# Patient Record
Sex: Female | Born: 1995 | Hispanic: Yes | Marital: Single | Smoking: Former smoker
Health system: Southern US, Community
[De-identification: ages and names within clinical notes are randomized; demographics above are authoritative.]

## PROBLEM LIST (undated history)

## (undated) ENCOUNTER — Inpatient Hospital Stay (HOSPITAL_COMMUNITY): Payer: Self-pay

## (undated) DIAGNOSIS — Z789 Other specified health status: Secondary | ICD-10-CM

## (undated) HISTORY — PX: WISDOM TOOTH EXTRACTION: SHX21

---

## 2018-01-07 ENCOUNTER — Encounter: Payer: Self-pay | Admitting: *Deleted

## 2018-01-08 ENCOUNTER — Other Ambulatory Visit (HOSPITAL_COMMUNITY)
Admission: RE | Admit: 2018-01-08 | Discharge: 2018-01-08 | Disposition: A | Discharge status: Home / Self Care | Payer: Medicaid Other | Source: Ambulatory Visit | Attending: Advanced Practice Midwife | Admitting: Advanced Practice Midwife

## 2018-01-08 ENCOUNTER — Ambulatory Visit (INDEPENDENT_AMBULATORY_CARE_PROVIDER_SITE_OTHER): Payer: Medicaid Other | Admitting: Advanced Practice Midwife

## 2018-01-08 ENCOUNTER — Encounter: Payer: Self-pay | Admitting: Advanced Practice Midwife

## 2018-01-08 VITALS — BP 120/71 | HR 88 | Ht 61.0 in | Wt 183.0 lb

## 2018-01-08 DIAGNOSIS — O219 Vomiting of pregnancy, unspecified: Secondary | ICD-10-CM

## 2018-01-08 DIAGNOSIS — Z3481 Encounter for supervision of other normal pregnancy, first trimester: Secondary | ICD-10-CM | POA: Insufficient documentation

## 2018-01-08 DIAGNOSIS — Z3480 Encounter for supervision of other normal pregnancy, unspecified trimester: Secondary | ICD-10-CM | POA: Insufficient documentation

## 2018-01-08 DIAGNOSIS — Z3687 Encounter for antenatal screening for uncertain dates: Secondary | ICD-10-CM

## 2018-01-08 MED ORDER — CONCEPT DHA 53.5-38-1 MG PO CAPS
1.0000 | ORAL_CAPSULE | Freq: Every day | ORAL | 12 refills | Status: DC
Start: 2018-01-08 — End: 2018-08-02

## 2018-01-08 MED ORDER — PROMETHAZINE HCL 25 MG PO TABS: 25.0000 mg | ORAL_TABLET | Freq: Four times a day (QID) | ORAL | 2 refills | Status: DC | PRN

## 2018-01-08 NOTE — Progress Notes (Signed)
  Subjective:    Katrina Foster is being seen today for her first obstetrical visit.  This is a planned pregnancy. She is at 6462w0d gestation by LMP and US today. Her obstetrical history is significant for nothing. Relationship with FOB: significant other, living together. Patient does intend to breast feed. Pregnancy history fully reviewed.  Patient reports headache, nausea and vomiting. Vomiting ~2 x per day after getting Rx Zofran at ED. Still having a lot of nausea and can't eat well. Hasn't lost weight. Stopped drinking coffee when she found out she was pregnant  Review of Systems:   Review of Systems  Constitutional: Negative for appetite change, chills and fever.  Eyes: Negative for visual disturbance.  Gastrointestinal: Positive for nausea and vomiting. Negative for abdominal pain and diarrhea.  Genitourinary: Negative for dysuria, pelvic pain, vaginal bleeding and vaginal discharge.  Neurological: Positive for headaches. Negative for dizziness.    Objective:     LMP 10/30/2017  Physical Exam  Nursing note and vitals reviewed. Constitutional: She is oriented to person, place, and time. She appears well-developed and well-nourished. No distress.  Eyes: No scleral icterus.  Cardiovascular: Normal rate, regular rhythm and normal heart sounds.  Respiratory: Effort normal and breath sounds normal. No respiratory distress.  GI: Soft. She exhibits no distension. There is no tenderness.  Genitourinary: Vagina normal. No vaginal discharge found.  Genitourinary Comments: Uterus 10 week size  Musculoskeletal: She exhibits no edema.  Neurological: She is alert and oriented to person, place, and time. She has normal reflexes.  Skin: Skin is warm and dry.  Psychiatric: She has a normal mood and affect.    Maternal Exam:  Introitus: Vagina is negative for discharge.  Pelvis: adequate for delivery.   Cervix: Cervix evaluated by sterile speculum exam and digital exam.     Fetal  Exam Fetal Monitor Review: Mode: ultrasound.   Baseline rate: Pos cardiac activity per US, 10.3 week CRL.         Assessment:    Pregnancy: G1P0 Patient Active Problem List   Diagnosis Date Noted  . Encounter for supervision of normal first pregnancy in first trimester 01/08/2018     1. Encounter for supervision of normal pregnancy in first trimester  - Culture, OB Urine - Urine cytology ancillary only - Sickle Cell Scr - Cystic fibrosis diagnostic study - Obstetric panel - HIV antibody (with reflex) - Cytology - PAP - Babyscripts Schedule Optimization  2. N/V pregnancy  - Phenergan     Plan:     Initial labs drawn. Prenatal vitamins. Problem list reviewed and updated. NIPS discussed: requested. Will do at [facility] since just 10 weeks today Role of ultrasound in pregnancy discussed; fetal survey: requested. Amniocentesis discussed: not indicated. Follow up in 4 weeks. Discussed clinic routines, schedule of care and testing, genetic screening options, involvement of students and residents under the direct supervision of APPs and doctors and presence of female providers. Pt verbalized understanding.   Dorathy KinsmanVirginia Chavela Justiniano 01/08/2018

## 2018-01-08 NOTE — Patient Instructions (Signed)

## 2018-01-11 LAB — CULTURE, OB URINE

## 2018-01-11 LAB — URINE CYTOLOGY ANCILLARY ONLY
CHLAMYDIA, DNA PROBE: NEGATIVE
NEISSERIA GONORRHEA: NEGATIVE

## 2018-01-11 LAB — URINE CULTURE, OB REFLEX

## 2018-01-12 LAB — CYTOLOGY - PAP: DIAGNOSIS: NEGATIVE

## 2018-01-16 LAB — CYSTIC FIBROSIS DIAGNOSTIC STUDY

## 2018-01-16 LAB — OBSTETRIC PANEL
Antibody Screen: NOT DETECTED
BASOS PCT: 0.6 %
Basophils Absolute: 65 cells/uL (ref 0–200)
EOS ABS: 294 {cells}/uL (ref 15–500)
Eosinophils Relative: 2.7 %
HEMATOCRIT: 38.2 % (ref 35.0–45.0)
Hemoglobin: 12.9 g/dL (ref 11.7–15.5)
Hepatitis B Surface Ag: NONREACTIVE
LYMPHS ABS: 1995 {cells}/uL (ref 850–3900)
MCH: 27.3 pg (ref 27.0–33.0)
MCHC: 33.8 g/dL (ref 32.0–36.0)
MCV: 80.8 fL (ref 80.0–100.0)
MONOS PCT: 5.2 %
MPV: 10 fL (ref 7.5–12.5)
Neutro Abs: 7979 cells/uL — ABNORMAL HIGH (ref 1500–7800)
Neutrophils Relative %: 73.2 %
Platelets: 426 10*3/uL — ABNORMAL HIGH (ref 140–400)
RBC: 4.73 10*6/uL (ref 3.80–5.10)
RDW: 14.5 % (ref 11.0–15.0)
RPR: NONREACTIVE
Rubella: 2.17 index
Total Lymphocyte: 18.3 %
WBC mixed population: 567 cells/uL (ref 200–950)
WBC: 10.9 10*3/uL — ABNORMAL HIGH (ref 3.8–10.8)

## 2018-01-16 LAB — SICKLE CELL SCREEN: Sickle Solubility Test - HGBRFX: NEGATIVE

## 2018-01-16 LAB — HIV ANTIBODY (ROUTINE TESTING W REFLEX): HIV: NONREACTIVE

## 2018-01-22 ENCOUNTER — Ambulatory Visit (INDEPENDENT_AMBULATORY_CARE_PROVIDER_SITE_OTHER): Payer: Medicaid Other | Admitting: Certified Nurse Midwife

## 2018-01-22 VITALS — BP 113/75 | HR 80 | Wt 178.0 lb

## 2018-01-22 DIAGNOSIS — O219 Vomiting of pregnancy, unspecified: Secondary | ICD-10-CM

## 2018-01-22 DIAGNOSIS — Z3402 Encounter for supervision of normal first pregnancy, second trimester: Secondary | ICD-10-CM

## 2018-01-22 DIAGNOSIS — Z3481 Encounter for supervision of other normal pregnancy, first trimester: Secondary | ICD-10-CM

## 2018-01-22 MED ORDER — SCOPOLAMINE 1 MG/3DAYS TD PT72: 1.0000 | MEDICATED_PATCH | TRANSDERMAL | 1 refills | Status: DC

## 2018-01-22 MED ORDER — RANITIDINE HCL 150 MG PO TABS: 150.0000 mg | ORAL_TABLET | Freq: Every day | ORAL | 1 refills | Status: DC

## 2018-01-22 MED ORDER — DOXYLAMINE-PYRIDOXINE 10-10 MG PO TBEC: 2.0000 | DELAYED_RELEASE_TABLET | Freq: Every day | ORAL | 1 refills | Status: DC

## 2018-01-22 NOTE — Patient Instructions (Signed)
Morning Sickness °Morning sickness is when you feel sick to your stomach (nauseous) during pregnancy. You may feel sick to your stomach and throw up (vomit). You may feel sick in the morning, but you can feel this way any time of day. Some women feel very sick to their stomach and cannot stop throwing up (hyperemesis gravidarum). °Follow these instructions at home: °· Only take medicines as told by your doctor. °· Take multivitamins as told by your doctor. Taking multivitamins before getting pregnant can stop or lessen the harshness of morning sickness. °· Eat dry toast or unsalted crackers before getting out of bed. °· Eat 5 to 6 small meals a day. °· Eat dry and bland foods like rice and baked potatoes. °· Do not drink liquids with meals. Drink between meals. °· Do not eat greasy, fatty, or spicy foods. °· Have someone cook for you if the smell of food causes you to feel sick or throw up. °· If you feel sick to your stomach after taking prenatal vitamins, take them at night or with a snack. °· Eat protein when you need a snack (nuts, yogurt, cheese). °· Eat unsweetened gelatins for dessert. °· Wear a bracelet used for sea sickness (acupressure wristband). °· Go to a doctor that puts thin needles into certain body points (acupuncture) to improve how you feel. °· Do not smoke. °· Use a humidifier to keep the air in your house free of odors. °· Get lots of fresh air. °Contact a doctor if: °· You need medicine to feel better. °· You feel dizzy or lightheaded. °· You are losing weight. °Get help right away if: °· You feel very sick to your stomach and cannot stop throwing up. °· You pass out (faint). °This information is not intended to replace advice given to you by your health care provider. Make sure you discuss any questions you have with your health care provider. °Document Released: 08/07/2004 Document Revised: 12/06/2015 Document Reviewed: 12/15/2012 °Elsevier Interactive Patient Education © 2017 Elsevier  Inc. °Eating Plan for Hyperemesis Gravidarum °Hyperemesis gravidarum is a severe form of morning sickness. Because this condition causes severe nausea and vomiting, it can lead to dehydration, malnutrition, and weight loss. One way to lessen the symptoms of nausea and vomiting is to follow the eating plan for hyperemesis gravidarum. It is often used along with prescribed medicines to control your symptoms. °What can I do to relieve my symptoms? °Listen to your body. Everyone is different and has different preferences. Find what works best for you. Take any of the following actions that are helpful to you: °· Eat and drink slowly. °· Eat 5-6 small meals daily instead of 3 large meals. °· Eat crackers before you get out of bed in the morning. °· Try having a snack in the middle of the night. °· Starchy foods are usually tolerated well. Examples include cereal, toast, bread, potatoes, pasta, rice, and pretzels. °· Ginger may help with nausea. Add ¼ tsp ground ginger to hot tea or choose ginger tea. °· Try drinking 100% fruit juice or an electrolyte drink. An electrolyte drink contains sodium, potassium, and chloride. °· Continue to take your prenatal vitamins as told by your health care provider. If you are having trouble taking your prenatal vitamins, talk with your health care provider about different options. °· Include at least 1 serving of protein with your meals and snacks. Protein options include meats or poultry, beans, nuts, eggs, and yogurt. Try eating a protein-rich snack before bed. Examples   of these snacks include cheese and crackers or half of a peanut butter or turkey sandwich. °· Consider eliminating foods that trigger your symptoms. These may include spicy foods, coffee, high-fat foods, very sweet foods, and acidic foods. °· Try meals that have more protein combined with bland, salty, lower-fat, and dry foods, such as nuts, seeds, pretzels, crackers, and cereal. °· Talk with your healthcare provider  about starting a supplement of vitamin B6. °· Have fluids that are cold, clear, and carbonated or sour. Examples include lemonade, ginger ale, lemon-lime soda, ice water, and sparkling water. °· Try lemon or mint tea. °· Try brushing your teeth or using a mouth rinse after meals. ° °What should I avoid to reduce my symptoms? °Avoiding some of the following things may help reduce your symptoms. °· Foods with strong smells. Try eating meals in well-ventilated areas that are free of odors. °· Drinking water or other beverages with meals. Try not to drink anything during the 30 minutes before and after your meals. °· Drinking more than 1 cup of fluid at a time. Sometimes using a straw helps. °· Fried or high-fat foods, such as butter and cream sauces. °· Spicy foods. °· Skipping meals as best as you can. Nausea can be more intense on an empty stomach. If you cannot tolerate food at that time, do not force it. Try sucking on ice chips or other frozen items, and make up for missed calories later. °· Lying down within 2 hours after eating. °· Environmental triggers. These may include smoky rooms, closed spaces, rooms with strong smells, warm or humid places, overly loud and noisy rooms, and rooms with motion or flickering lights. °· Quick and sudden changes in your movement. ° °This information is not intended to replace advice given to you by your health care provider. Make sure you discuss any questions you have with your health care provider. °Document Released: 04/27/2007 Document Revised: 02/27/2016 Document Reviewed: 01/29/2016 °Elsevier Interactive Patient Education © 2018 Elsevier Inc. ° °

## 2018-01-22 NOTE — Progress Notes (Signed)
Subjective:  Katrina Foster is a 22 y.o. G2P1001 at 3073w0d being seen today for ongoing prenatal care.  She is currently monitored for the following issues for this low-risk pregnancy and has Supervision of other normal pregnancy, antepartum and Nausea and vomiting of pregnancy, antepartum on their problem list.  Patient reports nausea and vomiting.   . Vag. Bleeding: None.  Movement: Absent. Denies leaking of fluid.   The following portions of the patient's history were reviewed and updated as appropriate: allergies, current medications, past family history, past medical history, past social history, past surgical history and problem list. Problem list updated.  Objective:   Vitals:   01/22/18 0924  BP: 113/75  Pulse: 80  Weight: 178 lb (80.7 kg)    Fetal Status: Fetal Heart Rate (bpm): 163   Movement: Absent     General:  Alert, oriented and cooperative. Patient is in no acute distress.  Skin: Skin is warm and dry. No rash noted.   Cardiovascular: Normal heart rate noted  Respiratory: Normal respiratory effort, no problems with respiration noted  Abdomen: Soft, gravid, appropriate for gestational age. Pain/Pressure: Absent     Pelvic: Vag. Bleeding: None Vag D/C Character: Thin   Cervical exam deferred        Extremities: Normal range of motion.  Edema: None  Mental Status: Normal mood and affect. Normal behavior. Normal judgment and thought content.   Urinalysis:      Assessment and Plan:  Pregnancy: G2P1001 at 2373w0d  1. Encounter for supervision of normal first pregnancy in second trimester - Genetic Screening - US MFM OB COMP + 14 WK; Future  2. Nausea/vomiting in pregnancy - vomiting daily - 5 lb weight loss - Phenergan and Zofran not helping - scopolamine (TRANSDERM-SCOP) 1 MG/3DAYS; Place 1 patch (1.5 mg total) onto the skin every 3 (three) days.  Dispense: 10 patch; Refill: 1 - Doxylamine-Pyridoxine 10-10 MG TBEC; Take 2 tablets by mouth at bedtime. May also  take 1 tab in morning and 1 tab in afternoon  Dispense: 100 tablet; Refill: 1 - ranitidine (ZANTAC) 150 MG tablet; Take 1 tablet (150 mg total) by mouth at bedtime.  Dispense: 60 tablet; Refill: 1   Preterm labor symptoms and general obstetric precautions including but not limited to vaginal bleeding, contractions, leaking of fluid and fetal movement were reviewed in detail with the patient. Please refer to After Visit Summary for other counseling recommendations.  Return in about 8 weeks (around 03/19/2018).   Donette LarryBhambri, Burke Terry, CNM

## 2018-01-22 NOTE — Progress Notes (Signed)
Error

## 2018-01-29 DIAGNOSIS — Z3480 Encounter for supervision of other normal pregnancy, unspecified trimester: Secondary | ICD-10-CM

## 2018-02-01 ENCOUNTER — Telehealth: Payer: Self-pay | Admitting: *Deleted

## 2018-02-01 NOTE — Telephone Encounter (Signed)
Pt called requesting her Nipt results which are still pending.  Her Scopolamine patches were not approved by her insurance but the Diglegis was.

## 2018-02-16 ENCOUNTER — Other Ambulatory Visit: Payer: Medicaid Other

## 2018-02-25 ENCOUNTER — Telehealth: Payer: Self-pay

## 2018-02-25 DIAGNOSIS — Z3480 Encounter for supervision of other normal pregnancy, unspecified trimester: Secondary | ICD-10-CM

## 2018-02-25 NOTE — Telephone Encounter (Signed)
Left message on pt's phone letting her know that Nips result was back. Asked pt to call the office to let us know if she wants to know over the phone or if she wants to pick it up in an envelope. I will also send MyChart message.

## 2018-03-03 ENCOUNTER — Encounter (HOSPITAL_COMMUNITY): Payer: Self-pay

## 2018-03-10 ENCOUNTER — Ambulatory Visit (HOSPITAL_COMMUNITY)
Admission: RE | Admit: 2018-03-10 | Discharge: 2018-03-10 | Disposition: A | Discharge status: Home / Self Care | Payer: Medicaid Other | Source: Ambulatory Visit | Attending: Certified Nurse Midwife | Admitting: Certified Nurse Midwife

## 2018-03-10 ENCOUNTER — Other Ambulatory Visit: Payer: Self-pay | Admitting: Certified Nurse Midwife

## 2018-03-10 DIAGNOSIS — O99212 Obesity complicating pregnancy, second trimester: Secondary | ICD-10-CM

## 2018-03-10 DIAGNOSIS — Z3480 Encounter for supervision of other normal pregnancy, unspecified trimester: Secondary | ICD-10-CM

## 2018-03-10 DIAGNOSIS — Z3402 Encounter for supervision of normal first pregnancy, second trimester: Secondary | ICD-10-CM

## 2018-03-10 DIAGNOSIS — Z363 Encounter for antenatal screening for malformations: Secondary | ICD-10-CM

## 2018-03-10 DIAGNOSIS — Z3A18 18 weeks gestation of pregnancy: Secondary | ICD-10-CM | POA: Diagnosis not present

## 2018-03-11 ENCOUNTER — Other Ambulatory Visit (HOSPITAL_COMMUNITY): Payer: Self-pay | Admitting: *Deleted

## 2018-03-11 DIAGNOSIS — O3503X0 Maternal care for (suspected) central nervous system malformation or damage in fetus, choroid plexus cysts, not applicable or unspecified: Secondary | ICD-10-CM

## 2018-03-11 DIAGNOSIS — O350XX0 Maternal care for (suspected) central nervous system malformation in fetus, not applicable or unspecified: Secondary | ICD-10-CM

## 2018-03-19 ENCOUNTER — Encounter: Payer: Self-pay | Admitting: Advanced Practice Midwife

## 2018-03-19 ENCOUNTER — Ambulatory Visit (INDEPENDENT_AMBULATORY_CARE_PROVIDER_SITE_OTHER): Payer: Medicaid Other | Admitting: Advanced Practice Midwife

## 2018-03-19 VITALS — BP 112/67 | HR 72 | Wt 174.0 lb

## 2018-03-19 DIAGNOSIS — O2612 Low weight gain in pregnancy, second trimester: Secondary | ICD-10-CM

## 2018-03-19 DIAGNOSIS — O2610 Low weight gain in pregnancy, unspecified trimester: Secondary | ICD-10-CM | POA: Insufficient documentation

## 2018-03-19 DIAGNOSIS — Z3482 Encounter for supervision of other normal pregnancy, second trimester: Secondary | ICD-10-CM

## 2018-03-19 DIAGNOSIS — Z3480 Encounter for supervision of other normal pregnancy, unspecified trimester: Secondary | ICD-10-CM

## 2018-03-19 NOTE — Patient Instructions (Signed)
[email or web address]   Pregnancy and Influenza Influenza, also called the flu, is an infection of the respiratory tract. If you are pregnant, you are more likely to catch the flu. You are also more likely to have a more serious case of the flu. This is because pregnancy lowers your body's ability to fight off infections (it weakens your immune system). It also puts additional stress on your heart and lungs, which makes you more likely to have complications. Having a bad case of the flu, especially with a high fever, can be dangerous for your developing baby. It can cause you to go into early labor. How do people get the flu? The flu is caused by the influenza virus. This virus is common every year in the fall and winter. It spreads when virus particles get passed from person to person. You can get the virus if you are near a sick person who is coughing or sneezing. You can also get the virus if you touch something that has the virus on it and then touch your face. How can I protect myself against the flu?  Get a flu shot. The best way to prevent the flu is to get a flu shot before flu season starts. The flu shot is not dangerous for your developing baby. It may even help protect your baby from the flu for up to 6 months after birth. The flu shot is one type of flu vaccine. Another type is a nasal spray vaccine. Do not get the nasal spray vaccine. It is not approved for pregnancy.  Do not come in close contact with sick people.  Do not share food, drinks, or utensils with other people.  Wash your hands often. Use hand sanitizer when soap and water are not available. What should I do if I have flu symptoms? If you have any flu symptoms, call your health care provider right away. Flu symptoms include:  Fever or chills.  Muscle aches.  Headache.  Sore throat.  Nasal congestion.  Cough.  Feeling tired.  Loss of appetite.  Vomiting.  Diarrhea.  You may be able to take an antiviral  medicine to keep the flu from becoming severe and to shorten how long it lasts. What should I do at home if I am diagnosed with the flu?  Do not take any medicine, including cold or flu medicine, unless directed by your health care provider.  If you take antiviral medicine, make sure you finish it even if you start to feel better.  Drink enough fluid to keep your urine clear or pale yellow.  Get plenty of rest. When would I seek immediate medical care if I have the flu?  You have trouble breathing.  You have chest pain.  You begin to have labor pains.  You have a high fever that does not go down after you take medicine.  You do not feel your baby move.  You have diarrhea or vomiting that will not go away. This information is not intended to replace advice given to you by your health care provider. Make sure you discuss any questions you have with your health care provider. Document Released: 05/02/2008 Document Revised: 12/06/2015 Document Reviewed: 05/27/2013 Elsevier Interactive Patient Education  2017 Elsevier Inc.   TDaP Vaccine Pregnancy Get the Whooping Cough Vaccine While You Are Pregnant (CDC)  It is important for women to get the whooping cough vaccine in the third trimester of each pregnancy. Vaccines are the best way to prevent this disease. There  are 2 different whooping cough vaccines. Both vaccines combine protection against whooping cough, tetanus and diphtheria, but they are for different age groups: Tdap: for everyone 11 years or older, including pregnant women  DTaP: for children 2 months through 51 years of age  You need the whooping cough vaccine during each of your pregnancies The recommended time to get the shot is during your 27th through 36th week of pregnancy, preferably during the earlier part of this time period. The Centers for Disease Control and Prevention (CDC) recommends that pregnant women receive the whooping cough vaccine for adolescents and  adults (called Tdap vaccine) during the third trimester of each pregnancy. The recommended time to get the shot is during your 27th through 36th week of pregnancy, preferably during the earlier part of this time period. This replaces the original recommendation that pregnant women get the vaccine only if they had not previously received it. The [employer] of [employer]stetricians and Gynecologists and the [employer] support this recommendation.  You should get the whooping cough vaccine while pregnant to pass protection to your baby frame support disabled and/or not supported in this browser  Learn why Katrina Foster decided to get the whooping cough vaccine in her 3rd trimester of pregnancy and how her baby girl was born with some protection against the disease. Also available on YouTube. After receiving the whooping cough vaccine, your body will create protective antibodies (proteins produced by the body to fight off diseases) and pass some of them to your baby before birth. These antibodies provide your baby some short-term protection against whooping cough in early life. These antibodies can also protect your baby from some of the more serious complications that come along with whooping cough. Your protective antibodies are at their highest about 2 weeks after getting the vaccine, but it takes time to pass them to your baby. So the preferred time to get the whooping cough vaccine is early in your third trimester. The amount of whooping cough antibodies in your body decreases over time. That is why CDC recommends you get a whooping cough vaccine during each pregnancy. Doing so allows each of your babies to get the greatest number of protective antibodies from you. This means each of your babies will get the best protection possible against this disease.  Getting the whooping cough vaccine while pregnant is better than getting the vaccine after you give birth Whooping cough vaccination  during pregnancy is ideal so your baby will have short-term protection as soon as he is born. This early protection is important because your baby will not start getting his whooping cough vaccines until he is 2 months old. These first few months of life are when your baby is at greatest risk for catching whooping cough. This is also when he's at greatest risk for having severe, potentially life-threating complications from the infection. To avoid that gap in protection, it is best to get a whooping cough vaccine during pregnancy. You will then pass protection to your baby before he is born. To continue protecting your baby, he should get whooping cough vaccines starting at 2 months old. You may never have gotten the Tdap vaccine before and did not get it during this pregnancy. If so, you should make sure to get the vaccine immediately after you give birth, before leaving the hospital or birthing center. It will take about 2 weeks before your body develops protection (antibodies) in response to the vaccine. Once you have protection from the vaccine, you  are less likely to give whooping cough to your newborn while caring for him. But remember, your baby will still be at risk for catching whooping cough from others. A recent study looked to see how effective Tdap was at preventing whooping cough in babies whose mothers got the vaccine while pregnant or in the hospital after giving birth. The study found that getting Tdap between 27 through 36 weeks of pregnancy is 85% more effective at preventing whooping cough in babies younger than 2 months old. Blood tests cannot tell if you need a whooping cough vaccine There are no blood tests that can tell you if you have enough antibodies in your body to protect yourself or your baby against whooping cough. Even if you have been sick with whooping cough in the past or previously received the vaccine, you still should get the vaccine during each pregnancy. Breastfeeding may  pass some protective antibodies onto your baby By breastfeeding, you may pass some antibodies you have made in response to the vaccine to your baby. When you get a whooping cough vaccine during your pregnancy, you will have antibodies in your breast milk that you can share with your baby as soon as your milk comes in. However, your baby will not get protective antibodies immediately if you wait to get the whooping cough vaccine until after delivering your baby. This is because it takes about 2 weeks for your body to create antibodies. Learn more about the health benefits of breastfeeding.

## 2018-03-19 NOTE — Progress Notes (Signed)
   PRENATAL VISIT NOTE  Subjective:  Katrina Foster is a 22 y.o. G2P1001 at [redacted]w[redacted]d being seen today for ongoing prenatal care.  She is currently monitored for the following issues for this low-risk pregnancy and has Supervision of other normal pregnancy, antepartum; Nausea and vomiting of pregnancy, antepartum; and Poor weight gain of pregnancy on their problem list.  Patient reports no complaints.   . Vag. Bleeding: None.  Movement: Present. Denies leaking of fluid.   Down 6 lb at 20 weeks. N/V have resolved, but pt can't eat full meals. Reports healthy diet. Encouraged small frequent, nutritionally-rich meals and snacks. Growth [place] scheduled.   Discussed difficulties w/ latch w/ first baby. Stopped BF after 1 month. Worried that it will happen again.   The following portions of the patient's history were reviewed and updated as appropriate: allergies, current medications, past family history, past medical history, past social history, past surgical history and problem list. Problem list updated.  Objective:   Vitals:   03/19/18 1017  BP: 112/67  Pulse: 72  Weight: 174 lb (78.9 kg)    Fetal Status: Fetal Heart Rate (bpm): 143   Movement: Present     General:  Alert, oriented and cooperative. Patient is in no acute distress.  Skin: Skin is warm and dry. No rash noted.   Cardiovascular: Normal heart rate noted  Respiratory: Normal respiratory effort, no problems with respiration noted  Abdomen: Soft, gravid, appropriate for gestational age.  Pain/Pressure: Absent     Pelvic: Cervical exam deferred        Extremities: Normal range of motion.  Edema: None  Mental Status: Normal mood and affect. Normal behavior. Normal judgment and thought content.   Assessment and Plan:  Pregnancy: G2P1001 at [redacted]w[redacted]d  1. Supervision of other normal pregnancy, antepartum  - F/U US 10/23 for CPC per MFM  - Alpha fetoprotein, maternal - Encouraged Flu shot, Lactation visit before  delivery  Preterm labor symptoms and general obstetric precautions including but not limited to vaginal bleeding, contractions, leaking of fluid and fetal movement were reviewed in detail with the patient. Please refer to After Visit Summary for other counseling recommendations.  Return in about 4 weeks (around 04/16/2018) for ROB.  Future Appointments  Date Time Provider Department Center  04/16/2018 10:15 AM Rolm Bookbinder, CNM [facility]  05/05/2018  3:00 PM [facility] [place] 3 [facility]    Dorathy Kinsman, [place]

## 2018-03-22 LAB — ALPHA FETOPROTEIN, MATERNAL
AFP MoM: 1.06
AFP, Serum: 53 ng/mL
Calc'd Gestational Age: 20 weeks
Maternal Wt: 180 [lb_av]
Twins-AFP: 1

## 2018-04-15 ENCOUNTER — Telehealth: Payer: Self-pay | Admitting: *Deleted

## 2018-04-15 NOTE — Telephone Encounter (Signed)
Left patient an appointment reminder message because she has not contacted me to acknowledge or change appointment. MyChart message has not been read either.

## 2018-04-16 ENCOUNTER — Ambulatory Visit: Payer: Medicaid Other | Admitting: Advanced Practice Midwife

## 2018-04-17 NOTE — Progress Notes (Signed)
Pt unable to wait to be seen today. She rescheduled appointment and was not seen by provider.

## 2018-04-23 ENCOUNTER — Ambulatory Visit (INDEPENDENT_AMBULATORY_CARE_PROVIDER_SITE_OTHER): Payer: Medicaid Other | Admitting: Certified Nurse Midwife

## 2018-04-23 VITALS — BP 112/64 | HR 69 | Wt 179.0 lb

## 2018-04-23 DIAGNOSIS — Z3480 Encounter for supervision of other normal pregnancy, unspecified trimester: Secondary | ICD-10-CM

## 2018-04-23 DIAGNOSIS — Z23 Encounter for immunization: Secondary | ICD-10-CM

## 2018-04-23 DIAGNOSIS — O219 Vomiting of pregnancy, unspecified: Secondary | ICD-10-CM

## 2018-04-23 NOTE — Progress Notes (Signed)
Subjective:  Katrina Foster is a 22 y.o. G2P1001 at [redacted]w[redacted]d being seen today for ongoing prenatal care.  She is currently monitored for the following issues for this low-risk pregnancy and has Supervision of other normal pregnancy, antepartum; Nausea and vomiting of pregnancy, antepartum; and Poor weight gain of pregnancy on their problem list.  Patient reports no complaints.  Contractions: Not present. Vag. Bleeding: None.  Movement: Present. Denies leaking of fluid.   The following portions of the patient's history were reviewed and updated as appropriate: allergies, current medications, past family history, past medical history, past social history, past surgical history and problem list. Problem list updated.  Objective:   Vitals:   04/23/18 1028  BP: 112/64  Pulse: 69  Weight: 81.2 kg    Fetal Status: Fetal Heart Rate (bpm): 136   Movement: Present     General:  Alert, oriented and cooperative. Patient is in no acute distress.  Skin: Skin is warm and dry. No rash noted.   Cardiovascular: Normal heart rate noted  Respiratory: Normal respiratory effort, no problems with respiration noted  Abdomen: Soft, gravid, appropriate for gestational age. Pain/Pressure: Absent     Pelvic: Vag. Bleeding: None Vag D/C Character: Thin   Cervical exam deferred        Extremities: Normal range of motion.  Edema: None  Mental Status: Normal mood and affect. Normal behavior. Normal judgment and thought content.   Urinalysis:      Assessment and Plan:  Pregnancy: G2P1001 at [redacted]w[redacted]d  1. Supervision of other normal pregnancy, antepartum -flu vax today  2. Nausea and vomiting of pregnancy, antepartum - now gaining weight - sx resolved  Preterm labor symptoms and general obstetric precautions including but not limited to vaginal bleeding, contractions, leaking of fluid and fetal movement were reviewed in detail with the patient. Please refer to After Visit Summary for other counseling  recommendations.  Return in about 3 weeks (around 05/14/2018).   Donette Larry, CNM

## 2018-04-23 NOTE — Addendum Note (Signed)
Addended by: Granville Lewis on: 04/23/2018 10:57 AM   Modules accepted: Orders

## 2018-05-05 ENCOUNTER — Ambulatory Visit (HOSPITAL_COMMUNITY)
Admission: RE | Admit: 2018-05-05 | Discharge: 2018-05-05 | Disposition: A | Discharge status: Home / Self Care | Payer: Medicaid Other | Source: Ambulatory Visit | Attending: Certified Nurse Midwife | Admitting: Certified Nurse Midwife

## 2018-05-05 ENCOUNTER — Encounter (HOSPITAL_COMMUNITY): Payer: Self-pay

## 2018-05-05 DIAGNOSIS — Z3A26 26 weeks gestation of pregnancy: Secondary | ICD-10-CM | POA: Diagnosis not present

## 2018-05-05 DIAGNOSIS — O99212 Obesity complicating pregnancy, second trimester: Secondary | ICD-10-CM | POA: Diagnosis not present

## 2018-05-05 DIAGNOSIS — Z362 Encounter for other antenatal screening follow-up: Secondary | ICD-10-CM | POA: Diagnosis not present

## 2018-05-05 DIAGNOSIS — O2612 Low weight gain in pregnancy, second trimester: Secondary | ICD-10-CM

## 2018-05-05 DIAGNOSIS — O350XX0 Maternal care for (suspected) central nervous system malformation in fetus, not applicable or unspecified: Secondary | ICD-10-CM | POA: Insufficient documentation

## 2018-05-05 DIAGNOSIS — O3503X0 Maternal care for (suspected) central nervous system malformation or damage in fetus, choroid plexus cysts, not applicable or unspecified: Secondary | ICD-10-CM

## 2018-05-05 DIAGNOSIS — Z3480 Encounter for supervision of other normal pregnancy, unspecified trimester: Secondary | ICD-10-CM

## 2018-05-05 HISTORY — DX: Other specified health status: Z78.9

## 2018-05-14 ENCOUNTER — Ambulatory Visit (INDEPENDENT_AMBULATORY_CARE_PROVIDER_SITE_OTHER): Payer: Medicaid Other | Admitting: Certified Nurse Midwife

## 2018-05-14 VITALS — BP 110/60 | HR 88 | Wt 180.0 lb

## 2018-05-14 DIAGNOSIS — O350XX0 Maternal care for (suspected) central nervous system malformation in fetus, not applicable or unspecified: Secondary | ICD-10-CM

## 2018-05-14 DIAGNOSIS — Z3483 Encounter for supervision of other normal pregnancy, third trimester: Secondary | ICD-10-CM | POA: Diagnosis not present

## 2018-05-14 DIAGNOSIS — Z23 Encounter for immunization: Secondary | ICD-10-CM

## 2018-05-14 DIAGNOSIS — Z3480 Encounter for supervision of other normal pregnancy, unspecified trimester: Secondary | ICD-10-CM

## 2018-05-14 DIAGNOSIS — O3503X0 Maternal care for (suspected) central nervous system malformation or damage in fetus, choroid plexus cysts, not applicable or unspecified: Secondary | ICD-10-CM | POA: Insufficient documentation

## 2018-05-14 NOTE — Progress Notes (Signed)
Subjective:  Katrina Foster is a 22 y.o. G2P1001 at [redacted]w[redacted]d being seen today for ongoing prenatal care.  She is currently monitored for the following issues for this low-risk pregnancy and has Supervision of other normal pregnancy, antepartum; Nausea and vomiting of pregnancy, antepartum; Poor weight gain of pregnancy; and Choroid plexus cyst of fetus affecting care of mother, antepartum on their problem list.  Patient reports no complaints.  Contractions: Not present. Vag. Bleeding: None.  Movement: Present. Denies leaking of fluid.   The following portions of the patient's history were reviewed and updated as appropriate: allergies, current medications, past family history, past medical history, past social history, past surgical history and problem list. Problem list updated.  Objective:   Vitals:   05/14/18 0928  BP: 110/60  Pulse: 88  Weight: 81.6 kg    Fetal Status: Fetal Heart Rate (bpm): 148 Fundal Height: 28 cm Movement: Present     General:  Alert, oriented and cooperative. Patient is in no acute distress.  Skin: Skin is warm and dry. No rash noted.   Cardiovascular: Normal heart rate noted  Respiratory: Normal respiratory effort, no problems with respiration noted  Abdomen: Soft, gravid, appropriate for gestational age. Pain/Pressure: Absent     Pelvic: Vag. Bleeding: None Vag D/C Character: Thin   Cervical exam deferred        Extremities: Normal range of motion.  Edema: None  Mental Status: Normal mood and affect. Normal behavior. Normal judgment and thought content.   Urinalysis:      Assessment and Plan:  Pregnancy: G2P1001 at [redacted]w[redacted]d  1. Supervision of other normal pregnancy, antepartum - 2Hr GTT w/ 1 Hr Carpenter 75 g - CBC - HIV antibody (with reflex) - RPR  2. Choroid plexus cyst of fetus affecting care of mother, antepartum, single or unspecified fetus - Resolved on 26 wk [place] but absent nasal bone, NIPS normal, counseled on amniocentesis, declined -  She's requesting a second NIPS, discussed most likely not be covered by insurance, she will call back if she wants to repeat it and assume payment  Preterm labor symptoms and general obstetric precautions including but not limited to vaginal bleeding, contractions, leaking of fluid and fetal movement were reviewed in detail with the patient. Please refer to After Visit Summary for other counseling recommendations.  Return in about 2 weeks (around 05/28/2018).   Donette Larry, CNM

## 2018-05-17 ENCOUNTER — Encounter: Payer: Self-pay | Admitting: *Deleted

## 2018-05-17 LAB — CBC
HEMATOCRIT: 32.2 % — AB (ref 35.0–45.0)
Hemoglobin: 10.8 g/dL — ABNORMAL LOW (ref 11.7–15.5)
MCH: 27.1 pg (ref 27.0–33.0)
MCHC: 33.5 g/dL (ref 32.0–36.0)
MCV: 80.9 fL (ref 80.0–100.0)
MPV: 10.3 fL (ref 7.5–12.5)
Platelets: 385 10*3/uL (ref 140–400)
RBC: 3.98 10*6/uL (ref 3.80–5.10)
RDW: 13.3 % (ref 11.0–15.0)
WBC: 10.4 10*3/uL (ref 3.8–10.8)

## 2018-05-17 LAB — RPR: RPR: NONREACTIVE

## 2018-05-17 LAB — 2HR GTT W 1 HR, CARPENTER, 75 G
Glucose, 1 Hr, Gest: 90 mg/dL (ref 65–179)
Glucose, 2 Hr, Gest: 70 mg/dL (ref 65–152)
Glucose, Fasting, Gest: 64 mg/dL — ABNORMAL LOW (ref 65–91)

## 2018-05-17 LAB — HIV ANTIBODY (ROUTINE TESTING W REFLEX): HIV 1&2 Ab, 4th Generation: NONREACTIVE

## 2018-05-19 ENCOUNTER — Encounter: Payer: Self-pay | Admitting: Certified Nurse Midwife

## 2018-06-07 ENCOUNTER — Encounter: Payer: Medicaid Other | Admitting: Family Medicine

## 2018-06-08 ENCOUNTER — Encounter: Payer: Medicaid Other | Admitting: Advanced Practice Midwife

## 2018-06-08 NOTE — Progress Notes (Deleted)
   PRENATAL VISIT NOTE  Subjective:  Katrina Foster is a 22 y.o. G2P1001 at 523w4d being seen today for ongoing prenatal care.  She is currently monitored for the following issues for this {Blank single:19197::"high-risk","low-risk"} pregnancy and has Supervision of other normal pregnancy, antepartum; Nausea and vomiting of pregnancy, antepartum; Poor weight gain of pregnancy; and Choroid plexus cyst of fetus affecting care of mother, antepartum on their problem list.  Patient reports {sx:14538}.   .  .   . Denies leaking of fluid.   The following portions of the patient's history were reviewed and updated as appropriate: allergies, current medications, past family history, past medical history, past social history, past surgical history and problem list. Problem list updated.  Objective:  There were no vitals filed for this visit.  Fetal Status:           General:  Alert, oriented and cooperative. Patient is in no acute distress.  Skin: Skin is warm and dry. No rash noted.   Cardiovascular: Normal heart rate noted  Respiratory: Normal respiratory effort, no problems with respiration noted  Abdomen: Soft, gravid, appropriate for gestational age.        Pelvic: {Blank single:19197::"Cervical exam performed","Cervical exam deferred"}        Extremities: Normal range of motion.     Mental Status: Normal mood and affect. Normal behavior. Normal judgment and thought content.   Assessment and Plan:  Pregnancy: G2P1001 at 533w4d  1. Supervision of other normal pregnancy, antepartum ***  2. Low weight gain during pregnancy in second trimester ***  {Blank single:19197::"Term","Preterm"} labor symptoms and general obstetric precautions including but not limited to vaginal bleeding, contractions, leaking of fluid and fetal movement were reviewed in detail with the patient. Please refer to After Visit Summary for other counseling recommendations.  No follow-ups on file.  Future  Appointments  Date Time Provider Department Center  06/08/2018  9:45 AM Leftwich-Kirby, Wilmer FloorLisa A, CNM [facility]    Sharen CounterLisa Leftwich-Kirby, CNM

## 2018-06-15 ENCOUNTER — Encounter: Payer: Self-pay | Admitting: *Deleted

## 2018-06-15 ENCOUNTER — Ambulatory Visit (INDEPENDENT_AMBULATORY_CARE_PROVIDER_SITE_OTHER): Payer: Medicaid Other | Admitting: Advanced Practice Midwife

## 2018-06-15 VITALS — BP 113/76 | HR 80 | Wt 185.0 lb

## 2018-06-15 DIAGNOSIS — O2612 Low weight gain in pregnancy, second trimester: Secondary | ICD-10-CM

## 2018-06-15 DIAGNOSIS — O2613 Low weight gain in pregnancy, third trimester: Secondary | ICD-10-CM

## 2018-06-15 DIAGNOSIS — R109 Unspecified abdominal pain: Secondary | ICD-10-CM

## 2018-06-15 DIAGNOSIS — O26893 Other specified pregnancy related conditions, third trimester: Secondary | ICD-10-CM

## 2018-06-15 NOTE — Patient Instructions (Signed)
Third Trimester of Pregnancy The third trimester is from week 28 through week 40 (months 7 through 9). The third trimester is a time when the unborn baby (fetus) is growing rapidly. At the end of the ninth month, the fetus is about 20 inches in length and weighs 6-10 pounds. Body changes during your third trimester Your body will continue to go through many changes during pregnancy. The changes vary from woman to woman. During the third trimester:  Your weight will continue to increase. You can expect to gain 25-35 pounds (11-16 kg) by the end of the pregnancy.  You may begin to get stretch marks on your hips, abdomen, and breasts.  You may urinate more often because the fetus is moving lower into your pelvis and pressing on your bladder.  You may develop or continue to have heartburn. This is caused by increased hormones that slow down muscles in the digestive tract.  You may develop or continue to have constipation because increased hormones slow digestion and cause the muscles that push waste through your intestines to relax.  You may develop hemorrhoids. These are swollen veins (varicose veins) in the rectum that can itch or be painful.  You may develop swollen, bulging veins (varicose veins) in your legs.  You may have increased body aches in the pelvis, back, or thighs. This is due to weight gain and increased hormones that are relaxing your joints.  You may have changes in your hair. These can include thickening of your hair, rapid growth, and changes in texture. Some women also have hair loss during or after pregnancy, or hair that feels dry or thin. Your hair will most likely return to normal after your baby is born.  Your breasts will continue to grow and they will continue to become tender. A yellow fluid (colostrum) may leak from your breasts. This is the first milk you are producing for your baby.  Your belly button may stick out.  You may notice more swelling in your hands,  face, or ankles.  You may have increased tingling or numbness in your hands, arms, and legs. The skin on your belly may also feel numb.  You may feel short of breath because of your expanding uterus.  You may have more problems sleeping. This can be caused by the size of your belly, increased need to urinate, and an increase in your body's metabolism.  You may notice the fetus "dropping," or moving lower in your abdomen (lightening).  You may have increased vaginal discharge.  You may notice your joints feel loose and you may have pain around your pelvic bone.  What to expect at prenatal visits You will have prenatal exams every 2 weeks until week 36. Then you will have weekly prenatal exams. During a routine prenatal visit:  You will be weighed to make sure you and the baby are growing normally.  Your blood pressure will be taken.  Your abdomen will be measured to track your baby's growth.  The fetal heartbeat will be listened to.  Any test results from the previous visit will be discussed.  You may have a cervical check near your due date to see if your cervix has softened or thinned (effaced).  You will be tested for Group B streptococcus. This happens between 35 and 37 weeks.  Your health care provider may ask you:  What your birth plan is.  How you are feeling.  If you are feeling the baby move.  If you have had   any abnormal symptoms, such as leaking fluid, bleeding, severe headaches, or abdominal cramping.  If you are using any tobacco products, including cigarettes, chewing tobacco, and electronic cigarettes.  If you have any questions.  Other tests or screenings that may be performed during your third trimester include:  Blood tests that check for low iron levels (anemia).  Fetal testing to check the health, activity level, and growth of the fetus. Testing is done if you have certain medical conditions or if there are problems during the  pregnancy.  Nonstress test (NST). This test checks the health of your baby to make sure there are no signs of problems, such as the baby not getting enough oxygen. During this test, a belt is placed around your belly. The baby is made to move, and its heart rate is monitored during movement.  What is false labor? False labor is a condition in which you feel small, irregular tightenings of the muscles in the womb (contractions) that usually go away with rest, changing position, or drinking water. These are called Braxton Hicks contractions. Contractions may last for hours, days, or even weeks before true labor sets in. If contractions come at regular intervals, become more frequent, increase in intensity, or become painful, you should see your health care provider. What are the signs of labor?  Abdominal cramps.  Regular contractions that start at 10 minutes apart and become stronger and more frequent with time.  Contractions that start on the top of the uterus and spread down to the lower abdomen and back.  Increased pelvic pressure and dull back pain.  A watery or bloody mucus discharge that comes from the vagina.  Leaking of amniotic fluid. This is also known as your "water breaking." It could be a slow trickle or a gush. Let your health care provider know if it has a color or strange odor. If you have any of these signs, call your health care provider right away, even if it is before your due date. Follow these instructions at home: Medicines  Follow your health care provider's instructions regarding medicine use. Specific medicines may be either safe or unsafe to take during pregnancy.  Take a prenatal vitamin that contains at least 600 micrograms (mcg) of folic acid.  If you develop constipation, try taking a stool softener if your health care provider approves. Eating and drinking  Eat a balanced diet that includes fresh fruits and vegetables, whole grains, good sources of protein  such as meat, eggs, or tofu, and low-fat dairy. Your health care provider will help you determine the amount of weight gain that is right for you.  Avoid raw meat and uncooked cheese. These carry germs that can cause birth defects in the baby.  If you have low calcium intake from food, talk to your health care provider about whether you should take a daily calcium supplement.  Eat four or five small meals rather than three large meals a day.  Limit foods that are high in fat and processed sugars, such as fried and sweet foods.  To prevent constipation: ? Drink enough fluid to keep your urine clear or pale yellow. ? Eat foods that are high in fiber, such as fresh fruits and vegetables, whole grains, and beans. Activity  Exercise only as directed by your health care provider. Most women can continue their usual exercise routine during pregnancy. Try to exercise for 30 minutes at least 5 days a week. Stop exercising if you experience uterine contractions.  Avoid heavy   lifting.  Do not exercise in extreme heat or humidity, or at high altitudes.  Wear low-heel, comfortable shoes.  Practice good posture.  You may continue to have sex unless your health care provider tells you otherwise. Relieving pain and discomfort  Take frequent breaks and rest with your legs elevated if you have leg cramps or low back pain.  Take warm sitz baths to soothe any pain or discomfort caused by hemorrhoids. Use hemorrhoid cream if your health care provider approves.  Wear a good support bra to prevent discomfort from breast tenderness.  If you develop varicose veins: ? Wear support pantyhose or compression stockings as told by your healthcare provider. ? Elevate your feet for 15 minutes, 3-4 times a day. Prenatal care  Write down your questions. Take them to your prenatal visits.  Keep all your prenatal visits as told by your health care provider. This is important. Safety  Wear your seat belt at  all times when driving.  Make a list of emergency phone numbers, including numbers for family, friends, the hospital, and police and fire departments. General instructions  Avoid cat litter boxes and soil used by cats. These carry germs that can cause birth defects in the baby. If you have a cat, ask someone to clean the litter box for you.  Do not travel far distances unless it is absolutely necessary and only with the approval of your health care provider.  Do not use hot tubs, steam rooms, or saunas.  Do not drink alcohol.  Do not use any products that contain nicotine or tobacco, such as cigarettes and e-cigarettes. If you need help quitting, ask your health care provider.  Do not use any medicinal herbs or unprescribed drugs. These chemicals affect the formation and growth of the baby.  Do not douche or use tampons or scented sanitary pads.  Do not cross your legs for long periods of time.  To prepare for the arrival of your baby: ? Take prenatal classes to understand, practice, and ask questions about labor and delivery. ? Make a trial run to the hospital. ? Visit the hospital and tour the maternity area. ? Arrange for maternity or paternity leave through employers. ? Arrange for family and friends to take care of pets while you are in the hospital. ? Purchase a rear-facing car seat and make sure you know how to install it in your car. ? Pack your hospital bag. ? Prepare the baby's nursery. Make sure to remove all pillows and stuffed animals from the baby's crib to prevent suffocation.  Visit your dentist if you have not gone during your pregnancy. Use a soft toothbrush to brush your teeth and be gentle when you floss. Contact a health care provider if:  You are unsure if you are in labor or if your water has broken.  You become dizzy.  You have mild pelvic cramps, pelvic pressure, or nagging pain in your abdominal area.  You have lower back pain.  You have persistent  nausea, vomiting, or diarrhea.  You have an unusual or bad smelling vaginal discharge.  You have pain when you urinate. Get help right away if:  Your water breaks before 37 weeks.  You have regular contractions less than 5 minutes apart before 37 weeks.  You have a fever.  You are leaking fluid from your vagina.  You have spotting or bleeding from your vagina.  You have severe abdominal pain or cramping.  You have rapid weight loss or weight gain.    You have shortness of breath with chest pain.  You notice sudden or extreme swelling of your face, hands, ankles, feet, or legs.  Your baby makes fewer than 10 movements in 2 hours.  You have severe headaches that do not go away when you take medicine.  You have vision changes. Summary  The third trimester is from week 28 through week 40, months 7 through 9. The third trimester is a time when the unborn baby (fetus) is growing rapidly.  During the third trimester, your discomfort may increase as you and your baby continue to gain weight. You may have abdominal, leg, and back pain, sleeping problems, and an increased need to urinate.  During the third trimester your breasts will keep growing and they will continue to become tender. A yellow fluid (colostrum) may leak from your breasts. This is the first milk you are producing for your baby.  False labor is a condition in which you feel small, irregular tightenings of the muscles in the womb (contractions) that eventually go away. These are called Braxton Hicks contractions. Contractions may last for hours, days, or even weeks before true labor sets in.  Signs of labor can include: abdominal cramps; regular contractions that start at 10 minutes apart and become stronger and more frequent with time; watery or bloody mucus discharge that comes from the vagina; increased pelvic pressure and dull back pain; and leaking of amniotic fluid. This information is not intended to replace advice  given to you by your health care provider. Make sure you discuss any questions you have with your health care provider. Document Released: 06/24/2001 Document Revised: 12/06/2015 Document Reviewed: 08/31/2012 Elsevier Interactive Patient Education  2017 [employer]  

## 2018-06-15 NOTE — Progress Notes (Signed)
   PRENATAL VISIT NOTE  Subjective:  Ruby Colasmeralda Luna-Torres is a 22 y.o. G2P1001 at 5156w4d being seen today for ongoing prenatal care.  She is currently monitored for the following issues for this low-risk pregnancy and has Supervision of other normal pregnancy, antepartum; Nausea and vomiting of pregnancy, antepartum; Poor weight gain of pregnancy; and Choroid plexus cyst of fetus affecting care of mother, antepartum on their problem list.  Patient reports occasional sharp pain in upper abdomen/epigastric area, resolve with position change.  Contractions: Not present. Vag. Bleeding: None.  Movement: Present. Denies leaking of fluid.   The following portions of the patient's history were reviewed and updated as appropriate: allergies, current medications, past family history, past medical history, past social history, past surgical history and problem list. Problem list updated.  Objective:   Vitals:   06/15/18 1314  BP: 113/76  Pulse: 80  Weight: 83.9 kg    Fetal Status: Fetal Heart Rate (bpm): 136 Fundal Height: 32 cm Movement: Present     General:  Alert, oriented and cooperative. Patient is in no acute distress.  Skin: Skin is warm and dry. No rash noted.   Cardiovascular: Normal heart rate noted  Respiratory: Normal respiratory effort, no problems with respiration noted  Abdomen: Soft, gravid, appropriate for gestational age.  Pain/Pressure: Absent     Pelvic: Cervical exam deferred        Extremities: Normal range of motion.  Edema: None  Mental Status: Normal mood and affect. Normal behavior. Normal judgment and thought content.   Assessment and Plan:  Pregnancy: G2P1001 at 7556w4d  1. Low weight gain during pregnancy in second trimester --Total weight gain charted at 4-5 lbs.  FH appropriate today. Pt reports eating and drinking well.  Continue to evaluate but no specific concerns today.  2. Abdominal pain during pregnancy, third trimester --Pain is likely musculoskeletal  related to baby position. Pain with certain positions that resolves with position change.  Cannot reproduce the pain to palpation in the office.  BP wnl, no h/a or visual changes.  Reasons to seek care reviewed.  Preterm labor symptoms and general obstetric precautions including but not limited to vaginal bleeding, contractions, leaking of fluid and fetal movement were reviewed in detail with the patient. Please refer to After Visit Summary for other counseling recommendations.  Return in about 2 weeks (around 06/29/2018).  No future appointments.  Sharen CounterLisa Leftwich-Kirby, CNM

## 2018-06-29 ENCOUNTER — Encounter: Payer: Medicaid Other | Admitting: Certified Nurse Midwife

## 2018-06-29 ENCOUNTER — Ambulatory Visit (INDEPENDENT_AMBULATORY_CARE_PROVIDER_SITE_OTHER): Payer: Medicaid Other | Admitting: Certified Nurse Midwife

## 2018-06-29 VITALS — BP 112/76 | HR 98 | Wt 185.0 lb

## 2018-06-29 DIAGNOSIS — O2613 Low weight gain in pregnancy, third trimester: Secondary | ICD-10-CM

## 2018-06-29 DIAGNOSIS — Z3480 Encounter for supervision of other normal pregnancy, unspecified trimester: Secondary | ICD-10-CM

## 2018-06-29 DIAGNOSIS — Z3483 Encounter for supervision of other normal pregnancy, third trimester: Secondary | ICD-10-CM

## 2018-06-29 MED ORDER — COMFORT FIT MATERNITY SUPP LG MISC: 1.0000 "application " | Freq: Every day | 0 refills | Status: DC

## 2018-06-29 NOTE — Patient Instructions (Signed)
   PREGNANCY SUPPORT BELT: You are not alone, Seventy-five percent of women have some sort of abdominal or back pain at some point in their pregnancy. Your baby is growing at a fast pace, which means that your whole body is rapidly trying to adjust to the changes. As your uterus grows, your back may start feeling a bit under stress and this can result in back or abdominal pain that can go from mild, and therefore bearable, to severe pains that will not allow you to sit or lay down comfortably, When it comes to dealing with pregnancy-related pains and cramps, some pregnant women usually prefer natural remedies, which the market is filled with nowadays. For example, wearing a pregnancy support belt can help ease and lessen your discomfort and pain. WHAT ARE THE BENEFITS OF WEARING A PREGNANCY SUPPORT BELT? A pregnancy support belt provides support to the lower portion of the belly taking some of the weight of the growing uterus and distributing to the other parts of your body. It is designed make you comfortable and gives you extra support. Over the years, the pregnancy apparel market has been studying the needs and wants of pregnant women and they have come up with the most comfortable pregnancy support belts that woman could ever ask for. In fact, you will no longer have to wear a stretched-out or bulky pregnancy belt that is visible underneath your clothes and makes you feel even more uncomfortable. Nowadays, a pregnancy support belt is made of comfortable and stretchy materials that will not irritate your skin but will actually make you feel at ease and you will not even notice you are wearing it. They are easy to put on and adjust during the day and can be worn at night for additional support.  BENEFITS: . Relives Back pain . Relieves Abdominal Muscle and Leg Pain . Stabilizes the Pelvic Ring . Offers a Cushioned Abdominal Lift Pad . Relieves pressure on the Sciatic Nerve Within Minutes WHERE TO GET  YOUR PREGNANCY BELT: [employer] [facility] [employer] [phone]  [address] [place], [place] [id number]  [facility] [place]

## 2018-06-29 NOTE — Progress Notes (Signed)
Subjective:  Katrina Foster is a 22 y.o. G2P1001 at 2154w4d being seen today for ongoing prenatal care.  She is currently monitored for the following issues for this low-risk pregnancy and has Supervision of other normal pregnancy, antepartum; Nausea and vomiting of pregnancy, antepartum; Poor weight gain of pregnancy; and Choroid plexus cyst of fetus affecting care of mother, antepartum on their problem list.  Patient reports no complaints.  Contractions: Not present. Vag. Bleeding: None.  Movement: Present. Denies leaking of fluid.   The following portions of the patient's history were reviewed and updated as appropriate: allergies, current medications, past family history, past medical history, past social history, past surgical history and problem list. Problem list updated.  Objective:   Vitals:   06/29/18 1316  BP: 112/76  Pulse: 98  Weight: 83.9 kg    Fetal Status: Fetal Heart Rate (bpm): 136 Fundal Height: 34 cm Movement: Present  Presentation: Vertex  General:  Alert, oriented and cooperative. Patient is in no acute distress.  Skin: Skin is warm and dry. No rash noted.   Cardiovascular: Normal heart rate noted  Respiratory: Normal respiratory effort, no problems with respiration noted  Abdomen: Soft, gravid, appropriate for gestational age. Pain/Pressure: Present     Pelvic: Vag. Bleeding: None Vag D/C Character: Thin   Cervical exam deferred        Extremities: Normal range of motion.  Edema: None  Mental Status: Normal mood and affect. Normal behavior. Normal judgment and thought content.   Urinalysis:      Assessment and Plan:  Pregnancy: G2P1001 at 6754w4d  1. Supervision of other normal pregnancy, antepartum  2. Low weight gain during pregnancy in third trimester - normal FH but only 4 lb weight gain; eating well - US for growth - US MFM OB FOLLOW UP; Future  Preterm labor symptoms and general obstetric precautions including but not limited to vaginal  bleeding, contractions, leaking of fluid and fetal movement were reviewed in detail with the patient. Please refer to After Visit Summary for other counseling recommendations.  Return in about 2 weeks (around 07/13/2018).   Donette LarryBhambri, Kenneith Stief, CNM

## 2018-07-05 ENCOUNTER — Ambulatory Visit (HOSPITAL_COMMUNITY)
Admission: RE | Admit: 2018-07-05 | Discharge: 2018-07-05 | Disposition: A | Discharge status: Home / Self Care | Payer: Medicaid Other | Source: Ambulatory Visit | Attending: Certified Nurse Midwife | Admitting: Certified Nurse Midwife

## 2018-07-05 ENCOUNTER — Encounter (HOSPITAL_COMMUNITY): Payer: Self-pay

## 2018-07-05 DIAGNOSIS — Z3480 Encounter for supervision of other normal pregnancy, unspecified trimester: Secondary | ICD-10-CM

## 2018-07-05 DIAGNOSIS — O2613 Low weight gain in pregnancy, third trimester: Secondary | ICD-10-CM | POA: Insufficient documentation

## 2018-07-05 DIAGNOSIS — Z362 Encounter for other antenatal screening follow-up: Secondary | ICD-10-CM

## 2018-07-05 DIAGNOSIS — Z3A35 35 weeks gestation of pregnancy: Secondary | ICD-10-CM | POA: Diagnosis not present

## 2018-07-05 DIAGNOSIS — O350XX0 Maternal care for (suspected) central nervous system malformation in fetus, not applicable or unspecified: Secondary | ICD-10-CM

## 2018-07-05 DIAGNOSIS — O99212 Obesity complicating pregnancy, second trimester: Secondary | ICD-10-CM | POA: Diagnosis not present

## 2018-07-05 DIAGNOSIS — O3503X0 Maternal care for (suspected) central nervous system malformation or damage in fetus, choroid plexus cysts, not applicable or unspecified: Secondary | ICD-10-CM

## 2018-07-11 ENCOUNTER — Encounter (HOSPITAL_COMMUNITY): Payer: Self-pay

## 2018-07-11 ENCOUNTER — Inpatient Hospital Stay (HOSPITAL_COMMUNITY)
Admission: AD | Admit: 2018-07-11 | Discharge: 2018-07-11 | Disposition: A | Discharge status: Home / Self Care | Payer: Medicaid Other | Source: Ambulatory Visit | Attending: Obstetrics and Gynecology | Admitting: Obstetrics and Gynecology

## 2018-07-11 ENCOUNTER — Other Ambulatory Visit: Payer: Self-pay

## 2018-07-11 DIAGNOSIS — O99513 Diseases of the respiratory system complicating pregnancy, third trimester: Secondary | ICD-10-CM | POA: Diagnosis not present

## 2018-07-11 DIAGNOSIS — O358XX0 Maternal care for other (suspected) fetal abnormality and damage, not applicable or unspecified: Secondary | ICD-10-CM | POA: Insufficient documentation

## 2018-07-11 DIAGNOSIS — Z87891 Personal history of nicotine dependence: Secondary | ICD-10-CM | POA: Insufficient documentation

## 2018-07-11 DIAGNOSIS — N949 Unspecified condition associated with female genital organs and menstrual cycle: Secondary | ICD-10-CM | POA: Diagnosis not present

## 2018-07-11 DIAGNOSIS — Z3A36 36 weeks gestation of pregnancy: Secondary | ICD-10-CM | POA: Diagnosis not present

## 2018-07-11 DIAGNOSIS — J101 Influenza due to other identified influenza virus with other respiratory manifestations: Secondary | ICD-10-CM | POA: Diagnosis not present

## 2018-07-11 DIAGNOSIS — Z0371 Encounter for suspected problem with amniotic cavity and membrane ruled out: Secondary | ICD-10-CM | POA: Diagnosis not present

## 2018-07-11 DIAGNOSIS — O26893 Other specified pregnancy related conditions, third trimester: Secondary | ICD-10-CM | POA: Insufficient documentation

## 2018-07-11 LAB — URINALYSIS, ROUTINE W REFLEX MICROSCOPIC
BACTERIA UA: NONE SEEN
Bilirubin Urine: NEGATIVE
Glucose, UA: NEGATIVE mg/dL
HGB URINE DIPSTICK: NEGATIVE
Ketones, ur: NEGATIVE mg/dL
NITRITE: NEGATIVE
PH: 7 (ref 5.0–8.0)
Protein, ur: NEGATIVE mg/dL
SPECIFIC GRAVITY, URINE: 1.011 (ref 1.005–1.030)

## 2018-07-11 LAB — INFLUENZA PANEL BY PCR (TYPE A & B)
INFLAPCR: NEGATIVE
Influenza B By PCR: POSITIVE — AB

## 2018-07-11 MED ORDER — DIPHENHYDRAMINE HCL 25 MG PO CAPS
25.0000 mg | ORAL_CAPSULE | Freq: Once | ORAL | Status: AC
  Administered 2018-07-11: 25 mg via ORAL
  Filled 2018-07-11: qty 1

## 2018-07-11 MED ORDER — ACETAMINOPHEN 500 MG PO TABS: 1000.0000 mg | ORAL_TABLET | Freq: Four times a day (QID) | ORAL | 0 refills | Status: DC | PRN

## 2018-07-11 MED ORDER — OSELTAMIVIR PHOSPHATE 75 MG PO CAPS
75.0000 mg | ORAL_CAPSULE | Freq: Once | ORAL | Status: AC
  Administered 2018-07-11: 75 mg via ORAL
  Filled 2018-07-11: qty 1

## 2018-07-11 MED ORDER — ACETAMINOPHEN 500 MG PO TABS
1000.0000 mg | ORAL_TABLET | Freq: Four times a day (QID) | ORAL | Status: DC | PRN
  Administered 2018-07-11: 1000 mg via ORAL
  Filled 2018-07-11: qty 2

## 2018-07-11 MED ORDER — OSELTAMIVIR PHOSPHATE 75 MG PO CAPS: 75.0000 mg | ORAL_CAPSULE | Freq: Two times a day (BID) | ORAL | 0 refills | Status: DC

## 2018-07-11 NOTE — MAU Provider Note (Signed)
Chief Complaint:  Nasal Congestion; Chest Pain; Dizziness; Urinary Frequency; Rupture of Membranes; and Pelvic Pain   First Provider Initiated Contact with Patient 07/11/18 1807     HPI: Katrina Foster is a 22 y.o. G2P1001 at [redacted]w[redacted]d who presents to maternity admissions reporting headache, congestion, cough, sore throat since yesterday. Felt hot and thought she might have a fever, but did not take temp. Also reports increased pelvic pressure and leakage of urine vs leaking of fluid w/ coughing.   Headache Location: generalized Quality: throbbing Severity: Moderate  Duration: two days Timing: constant Modifying factors: No relief w/ 500 mg Tylenol Associated signs and symptoms: Pos for subjective fever, congestion, runny nose, sore throat, cough. Neg for vision changes, weakness.   Unsure if she is having contractions. Neg vaginal bleeding. Good fetal movement.   Pregnancy Course:  Patient Active Problem List   Diagnosis Date Noted  . Choroid plexus cyst of fetus affecting care of mother, antepartum 05/14/2018  . Poor weight gain of pregnancy 03/19/2018  . Nausea and vomiting of pregnancy, antepartum 01/22/2018  . Supervision of other normal pregnancy, antepartum 01/08/2018     Past Medical History:  Diagnosis Date  . Medical history non-contributory    OB History  Gravida Para Term Preterm AB Living  2 1 1     1   SAB TAB Ectopic Multiple Live Births          1    # Outcome Date GA Lbr Len/2nd Weight Sex Delivery Anes PTL Lv  2 Current           1 Term 12/11/14 [redacted]w[redacted]d    Vag-Spont None  LIV   Past Surgical History:  Procedure Laterality Date  . WISDOM TOOTH EXTRACTION     History reviewed. No pertinent family history. Social History   Tobacco Use  . Smoking status: Former Games developer  . Smokeless tobacco: Never Used  Substance Use Topics  . Alcohol use: Not Currently  . Drug use: Never   No Known Allergies Medications Prior to Admission  Medication Sig Dispense  Refill Last Dose  . Elastic Bandages & Supports (COMFORT FIT MATERNITY SUPP LG) MISC 1 application by Does not apply route daily. 1 each 0   . Prenat-FeFum-FePo-FA-Omega 3 (CONCEPT DHA) 53.5-38-1 MG CAPS Take 1 tablet by mouth daily. 30 capsule 12 Taking  . ranitidine (ZANTAC) 150 MG tablet Take 1 tablet (150 mg total) by mouth at bedtime. (Patient not taking: Reported on 06/15/2018) 60 tablet 1 Not Taking    I have reviewed patient's Past Medical Hx, Surgical Hx, Family Hx, Social Hx, medications and allergies.   ROS:  Review of Systems  Constitutional: Positive for fatigue and fever (subjective). Negative for chills.  HENT: Positive for congestion and rhinorrhea. Negative for ear pain and trouble swallowing.   Eyes: Negative for photophobia and visual disturbance.  Respiratory: Positive for cough. Negative for shortness of breath and wheezing.   Gastrointestinal: Negative for abdominal pain, nausea and vomiting.  Genitourinary: Positive for pelvic pain. Negative for difficulty urinating, dysuria, flank pain, frequency, hematuria, urgency, vaginal bleeding and vaginal discharge.  Musculoskeletal: Positive for myalgias. Negative for back pain, neck pain and neck stiffness.  Skin: Negative for rash.  Neurological: Positive for headaches. Negative for dizziness, speech difficulty and weakness.    Physical Exam   Patient Vitals for the past 24 hrs:  BP Temp Temp src Pulse Resp SpO2 Height Weight  07/11/18 1713 120/72 98.8 F (37.1 C) Oral 100 18 100 % 5'  1" (1.549 m) 83.7 kg   Constitutional: Well-developed, well-nourished female in mild distress. Mildly ill-appearing.  Cardiovascular: normal rate Respiratory: normal effort. Pos congestion, rhinorrhea GI: Abd soft, non-tender, gravid appropriate for gestational age.  MS: Extremities nontender, no edema, normal ROM Neurologic: Alert and oriented x 4.  GU: Neg CVAT.  Pelvic: NEFG, physiologic discharge, neg pooling, no blood, cervix  clean. No CMT   Cervix: FT/long, soft  FHT:  Baseline 135 , moderate variability, accelerations present, no decelerations Contractions: UI   Labs: Results for orders placed or performed during the hospital encounter of 07/11/18 (from the past 24 hour(s))  Urinalysis, Routine w reflex microscopic     Status: Abnormal   Collection Time: 07/11/18  5:21 PM  Result Value Ref Range   Color, Urine YELLOW YELLOW   APPearance HAZY (A) CLEAR   Specific Gravity, Urine 1.011 1.005 - 1.030   pH 7.0 5.0 - 8.0   Glucose, UA NEGATIVE NEGATIVE mg/dL   Hgb urine dipstick NEGATIVE NEGATIVE   Bilirubin Urine NEGATIVE NEGATIVE   Ketones, ur NEGATIVE NEGATIVE mg/dL   Protein, ur NEGATIVE NEGATIVE mg/dL   Nitrite NEGATIVE NEGATIVE   Leukocytes, UA MODERATE (A) NEGATIVE   RBC / HPF 0-5 0 - 5 RBC/hpf   WBC, UA 0-5 0 - 5 WBC/hpf   Bacteria, UA NONE SEEN NONE SEEN   Squamous Epithelial / LPF 11-20 0 - 5   Mucus PRESENT   Influenza panel by PCR (type A & B)     Status: Abnormal   Collection Time: 07/11/18  5:43 PM  Result Value Ref Range   Influenza A By PCR NEGATIVE NEGATIVE   Influenza B By PCR POSITIVE (A) NEGATIVE   Fern negative  Imaging:  NA  MAU Course: Orders Placed This Encounter  Procedures  . Culture, OB Urine  . Urinalysis, Routine w reflex microscopic  . Influenza panel by PCR (type A & B)  . Droplet Isolation  . POCT fern test   Meds ordered this encounter  Medications  . acetaminophen (TYLENOL) tablet 1,000 mg  . diphenhydrAMINE (BENADRYL) capsule 25 mg  . oseltamivir (TAMIFLU) capsule 75 mg  . acetaminophen (TYLENOL) 500 MG tablet    Sig: Take 2 tablets (1,000 mg total) by mouth every 6 (six) hours as needed for fever or headache.    Dispense:  30 tablet    Refill:  0    Order Specific Question:   Supervising Provider    Answer:   Conan BowensAVIS, KELLY M [[id number]]  . oseltamivir (TAMIFLU) 75 MG capsule    Sig: Take 1 capsule (75 mg total) by mouth 2 (two) times daily for 5 days.     Dispense:  10 capsule    Refill:  0    Order Specific Question:   Supervising Provider    Answer:   Conan BowensDAVIS, KELLY M [[id number]]    MDM: - Influenza B positive.  Will Tx w/ Tamiflu for 5 days and OTC meds for Sx.  - No evidence of ROM. Pool and fern neg. - Pelvic pressure--liekly Deberah PeltonBraxton Hicks of normal third rimester discomfort. Mef Leuks in UA, but otherwise Nml and no urinary complaints. Urine culture sent.  Assessment: 1. Influenza B   2. No leakage of amniotic fluid into vagina   3. Pelvic pressure in pregnancy, antepartum, third trimester     Plan: Discharge home in stable condition.  Labor precautions and fetal kick counts Flu precautions.  Increase fluids and rest.  Follow-up Information  [facility] [place]. Call.   Specialty:  Obstetrics and Gynecology Why:  to reschedule appointment around 07/16/18.  Contact information: [address], [address] [place] [id number] [phone]       [facility] Follow up.   Specialty:  Obstetrics and Gynecology Why:  in pregnancy emergencies Contact information: [address] [id number] mc [place] [id number] [phone]          Allergies as of 07/11/2018   No Known Allergies     Medication List    TAKE these medications   acetaminophen 500 MG tablet Commonly known as:  TYLENOL Take 2 tablets (1,000 mg total) by mouth every 6 (six) hours as needed for fever or headache.   COMFORT FIT MATERNITY SUPP LG Misc 1 application by Does not apply route daily.   CONCEPT DHA 53.5-38-1 MG Caps Take 1 tablet by mouth daily.   oseltamivir 75 MG capsule Commonly known as:  TAMIFLU Take 1 capsule (75 mg total) by mouth 2 (two) times daily for 5 days.   ranitidine 150 MG tablet Commonly known as:  ZANTAC Take 1 tablet (150 mg total) by mouth at bedtime.       Katrinka BlazingSmith, [place], [place] 07/11/2018 7:17 PM

## 2018-07-11 NOTE — MAU Note (Addendum)
Has a runny nose, itchy throat, chest pain, and feels lightheaded  Reports a cough and states she felt like she had a fever at home- did not actually take her temp  States she is going to the bathroom frequently and is unsure if it is fluid of urine  Having pelvic pain  + FM

## 2018-07-11 NOTE — Discharge Instructions (Signed)
Pregnancy and Influenza  Influenza, also called the flu, is an infection of the lungs and airways (respiratory tract). If you are pregnant, you are more likely to catch the flu. You are also more likely to have a more serious case of the flu. This is because pregnancy causes changes to your bodys disease-fighting system (immune system), heart, and lungs. If you develop a bad case of the flu, especially with a high fever, this can cause problems for you and your developing baby. How do people get the flu? The flu is caused by a type of germ called a virus. It spreads when virus particles get passed from person to person by:  Being near a sick person who is coughing or sneezing.  Touching something that has the virus on it and then touching your mouth, nose, or face. The influenza virus is most common during the fall and winter. How can I protect myself against the flu?  Get a flu shot. The best way to prevent the flu is to get a flu shot before flu season starts. The flu shot is not dangerous for your developing baby. It may even help protect your baby from the flu for up to 6 months after birth.  Wash your hands often with soap and warm water. If soap and water are not available, use hand sanitizer.  Do not come in close contact with sick people.  Do not share food, drinks, or utensils with other people.  Avoid touching your eyes, nose, and mouth.  Clean frequently used surfaces at home, school, or work.  Practice healthy lifestyle habits, such as: ? Eating a healthy, balanced diet. ? Drinking plenty of fluids. ? Exercising regularly or as told by your health care provider. ? Sleeping 7-9 hours each night. ? Finding ways to manage stress. What should I do if I have flu symptoms?  If you have any symptoms of the flu, even after getting a flu shot, contact your health care provider right away.  To reduce fever, take over-the-counter acetaminophen as told by your health care  provider.  If you have the flu, you may get antiviral medicine to keep the flu from becoming severe and to shorten how long it lasts.  Avoid spreading the flu to others: ? Stay home until you are well. ? Cover your nose and mouth when you cough or sneeze. ? Wash your hands often. Follow these instructions at home:  Take over-the-counter and prescription medicines only as told by your health care provider. Do not take any medicine, including cold or flu medicine, unless your health care provider tells you to do so.  If you were prescribed antiviral medicine, take it as told by your health care provider. Do not stop taking the antiviral medicine even if you start to feel better.  Eat a nutrient-rich diet that includes fresh fruits and vegetables, whole grains, lean protein, and low-fat dairy.  Drink enough fluid to keep your urine clear or pale yellow.  Get plenty of rest. Contact a health care provider if:  You have fever or chills.  You have a cough, sore throat, or stuffy nose.  You have worsening or unusual: ? Muscle aches. ? Headache. ? Tiredness. ? Loss of appetite.  You have vomiting or diarrhea. Get help right away if:  You have trouble breathing.  You have chest pain.  You have abdominal pain.  You begin to have labor pains.  You have a fever that does not go down 24 hours  after you take medicine.  You do not feel your baby move.  You have diarrhea or vomiting that will not go away.  You have dizziness or confusion.  Your symptoms do not improve, even with treatment. Summary  If you are pregnant, you are more likely to catch the flu. You are also more likely to have a more serious case of the flu.  If you have flu-like symptoms, call your health care provider right away. If you develop a bad case of the flu, especially with a high fever, this can be dangerous for your developing baby.  The best way to prevent the flu is to get a flu shot before flu  season starts. The flu shot is not dangerous for your developing baby.  If you have the flu and were prescribed antiviral medicine, take it as told by your health care provider. This information is not intended to replace advice given to you by your health care provider. Make sure you discuss any questions you have with your health care provider. Document Released: 05/02/2008 Document Revised: 08/26/2016 Document Reviewed: 08/26/2016 Elsevier Interactive Patient Education  2019 [employer]  Safe Medications in Pregnancy   Acne: Benzoyl Peroxide Salicylic Acid  Backache/Headache: Tylenol: 2 regular strength every 4 hours OR              2 Extra strength every 6 hours  Colds/Coughs/Allergies: Benadryl (alcohol free) 25 mg every 6 hours as needed Breath right strips Claritin Cepacol throat lozenges Chloraseptic throat spray Cold-Eeze- up to three times per day Cough drops, alcohol free Flonase (by prescription only) Guaifenesin Mucinex Robitussin DM (plain only, alcohol free) Saline nasal spray/drops Sudafed (pseudoephedrine) & Actifed ** use only after [redacted] weeks gestation and if you do not have high blood pressure Tylenol Vicks Vaporub Zinc lozenges Zyrtec   Constipation: Colace Ducolax suppositories Fleet enema Glycerin suppositories Metamucil Milk of magnesia Miralax Senokot Smooth move tea  Diarrhea: Kaopectate Imodium A-D  *NO pepto Bismol  Hemorrhoids: Anusol Anusol HC Preparation H Tucks  Indigestion: Tums Maalox Mylanta Zantac  Pepcid  Insomnia: Benadryl (alcohol free) 25mg  every 6 hours as needed Tylenol PM Unisom, no Gelcaps  Leg Cramps: Tums MagGel  Nausea/Vomiting:  Bonine Dramamine Emetrol Ginger extract Sea bands Meclizine  Nausea medication to take during pregnancy:  Unisom (doxylamine succinate 25 mg tablets) Take one tablet daily at bedtime. If symptoms are not adequately controlled, the dose can be increased to a  maximum recommended dose of two tablets daily (1/2 tablet in the morning, 1/2 tablet mid-afternoon and one at bedtime). Vitamin B6 100mg  tablets. Take one tablet twice a day (up to 200 mg per day).  Skin Rashes: Aveeno products Benadryl cream or 25mg  every 6 hours as needed Calamine Lotion 1% cortisone cream  Yeast infection: Gyne-lotrimin 7 Monistat 7   **If taking multiple medications, please check labels to avoid duplicating the same active ingredients **take medication as directed on the label ** Do not exceed 4000 mg of tylenol in 24 hours **Do not take medications that contain aspirin or ibuprofen

## 2018-07-12 ENCOUNTER — Other Ambulatory Visit: Payer: Self-pay

## 2018-07-12 DIAGNOSIS — Z0371 Encounter for suspected problem with amniotic cavity and membrane ruled out: Secondary | ICD-10-CM

## 2018-07-12 DIAGNOSIS — J101 Influenza due to other identified influenza virus with other respiratory manifestations: Secondary | ICD-10-CM

## 2018-07-12 DIAGNOSIS — O26893 Other specified pregnancy related conditions, third trimester: Secondary | ICD-10-CM

## 2018-07-12 DIAGNOSIS — N949 Unspecified condition associated with female genital organs and menstrual cycle: Secondary | ICD-10-CM

## 2018-07-12 DIAGNOSIS — R102 Pelvic and perineal pain: Secondary | ICD-10-CM

## 2018-07-12 MED ORDER — OSELTAMIVIR PHOSPHATE 75 MG PO CAPS: 75.0000 mg | ORAL_CAPSULE | Freq: Two times a day (BID) | ORAL | 0 refills | Status: AC

## 2018-07-12 NOTE — Progress Notes (Signed)
Patient left MAU without paper copy of tamiflu RX. Electronic RX sent to pharmacy.  Rolm BookbinderCaroline M Iyana Topor, CNM 07/12/18 1:14 AM

## 2018-07-13 ENCOUNTER — Encounter: Payer: Medicaid Other | Admitting: Certified Nurse Midwife

## 2018-07-13 LAB — CULTURE, OB URINE: SPECIAL REQUESTS: NORMAL

## 2018-07-14 NOTE — L&D Delivery Note (Addendum)
Delivery Note At  a viable newborn boy was delivered via  (Presentation: vertex; LOA).  APGAR: 8, 9; weight: pending.   Placenta status: spontaneous, intact delivered with gentle cord traction.  Cord: three vessels, nuchal with manual reduction after delivery.   Anesthesia: none  Episiotomy:  none Lacerations:  none Suture Repair: n/a Est. Blood Loss (mL):  323 cc  Mom to postpartum.  Baby to Couplet care / Skin to Skin.  Cristine Polio 08/02/2018, 3:42 AM  OB FELLOW DELIVERY ATTESTATION  I was gloved and present for the delivery in its entirety, and I agree with the above resident's note.    Marcy Siren, D.O. OB Fellow  08/02/2018, 8:35 PM

## 2018-07-20 ENCOUNTER — Encounter: Payer: Medicaid Other | Admitting: Certified Nurse Midwife

## 2018-07-21 ENCOUNTER — Ambulatory Visit (INDEPENDENT_AMBULATORY_CARE_PROVIDER_SITE_OTHER): Payer: Medicaid Other | Admitting: Certified Nurse Midwife

## 2018-07-21 ENCOUNTER — Other Ambulatory Visit (HOSPITAL_COMMUNITY)
Admission: RE | Admit: 2018-07-21 | Discharge: 2018-07-21 | Disposition: A | Discharge status: Home / Self Care | Payer: Medicaid Other | Source: Ambulatory Visit | Attending: Certified Nurse Midwife | Admitting: Certified Nurse Midwife

## 2018-07-21 VITALS — BP 108/75 | HR 81 | Wt 188.0 lb

## 2018-07-21 DIAGNOSIS — Z3483 Encounter for supervision of other normal pregnancy, third trimester: Secondary | ICD-10-CM | POA: Insufficient documentation

## 2018-07-21 DIAGNOSIS — Z3480 Encounter for supervision of other normal pregnancy, unspecified trimester: Secondary | ICD-10-CM

## 2018-07-21 DIAGNOSIS — Z3A37 37 weeks gestation of pregnancy: Secondary | ICD-10-CM

## 2018-07-21 NOTE — Progress Notes (Signed)
Subjective:  Katrina Foster is a 23 y.o. G2P1001 at [redacted]w[redacted]d being seen today for ongoing prenatal care.  She is currently monitored for the following issues for this low-risk pregnancy and has Supervision of other normal pregnancy, antepartum; Nausea and vomiting of pregnancy, antepartum; Poor weight gain of pregnancy; and Choroid plexus cyst of fetus affecting care of mother, antepartum on their problem list.  Patient reports no complaints.  Contractions: Irritability. Vag. Bleeding: None.  Movement: Present. Denies leaking of fluid.   The following portions of the patient's history were reviewed and updated as appropriate: allergies, current medications, past family history, past medical history, past social history, past surgical history and problem list. Problem list updated.  Objective:   Vitals:   07/21/18 1059  BP: 108/75  Pulse: 81  Weight: 85.3 kg    Fetal Status: Fetal Heart Rate (bpm): 135 Fundal Height: 36 cm Movement: Present  Presentation: Vertex  General:  Alert, oriented and cooperative. Patient is in no acute distress.  Skin: Skin is warm and dry. No rash noted.   Cardiovascular: Normal heart rate noted  Respiratory: Normal respiratory effort, no problems with respiration noted  Abdomen: Soft, gravid, appropriate for gestational age. Pain/Pressure: Present     Pelvic: Vag. Bleeding: None Vag D/C Character: Thin   Cervical exam deferred        Extremities: Normal range of motion.  Edema: None  Mental Status: Normal mood and affect. Normal behavior. Normal judgment and thought content.   Urinalysis:      Assessment and Plan:  Pregnancy: G2P1001 at [redacted]w[redacted]d  1. Encounter for supervision of other normal pregnancy in third trimester - GC/Chlamydia probe amp ([place])not at [facility] - Culture, beta strep (group b only)   Term labor symptoms and general obstetric precautions including but not limited to vaginal bleeding, contractions, leaking of fluid and fetal  movement were reviewed in detail with the patient. Please refer to After Visit Summary for other counseling recommendations.  Return in about 1 week (around 07/28/2018).   Donette Larry, CNM

## 2018-07-22 LAB — GC/CHLAMYDIA PROBE AMP (~~LOC~~) NOT AT ARMC
CHLAMYDIA, DNA PROBE: NEGATIVE
NEISSERIA GONORRHEA: NEGATIVE

## 2018-07-24 LAB — CULTURE, BETA STREP (GROUP B ONLY)
MICRO NUMBER:: 27910
SPECIMEN QUALITY: ADEQUATE

## 2018-07-28 ENCOUNTER — Ambulatory Visit (INDEPENDENT_AMBULATORY_CARE_PROVIDER_SITE_OTHER): Payer: Medicaid Other | Admitting: Obstetrics and Gynecology

## 2018-07-28 DIAGNOSIS — Z3480 Encounter for supervision of other normal pregnancy, unspecified trimester: Secondary | ICD-10-CM

## 2018-07-28 NOTE — Progress Notes (Signed)
   PRENATAL VISIT NOTE  Subjective:  Katrina Foster is a 23 y.o. G2P1001 at [redacted]w[redacted]d being seen today for ongoing prenatal care.  She is currently monitored for the following issues for this low-risk pregnancy and has Supervision of other normal pregnancy, antepartum; Nausea and vomiting of pregnancy, antepartum; Poor weight gain of pregnancy; and Choroid plexus cyst of fetus affecting care of mother, antepartum on their problem list.  Patient reports no complaints.  Contractions: Irritability. Vag. Bleeding: None.  Movement: Present. Denies leaking of fluid.   The following portions of the patient's history were reviewed and updated as appropriate: allergies, current medications, past family history, past medical history, past social history, past surgical history and problem list. Problem list updated.  Objective:   Vitals:   07/28/18 0855  BP: 126/84  Pulse: 80  Weight: 191 lb (86.6 kg)    Fetal Status: Fetal Heart Rate (bpm): 129 Fundal Height: 38 cm Movement: Present  Presentation: Vertex  General:  Alert, oriented and cooperative. Patient is in no acute distress.  Skin: Skin is warm and dry. No rash noted.   Cardiovascular: Normal heart rate noted  Respiratory: Normal respiratory effort, no problems with respiration noted  Abdomen: Soft, gravid, appropriate for gestational age.  Pain/Pressure: Present     Pelvic: Cervical exam performed Dilation: Fingertip      Extremities: Normal range of motion.  Edema: None  Mental Status: Normal mood and affect. Normal behavior. Normal judgment and thought content.   Assessment and Plan:  Pregnancy: G2P1001 at [redacted]w[redacted]d  1. Supervision of other normal pregnancy, antepartum  - Doing well - GBS negative  - Discussed induction at 41 weeks if needed.   Term labor symptoms and general obstetric precautions including but not limited to vaginal bleeding, contractions, leaking of fluid and fetal movement were reviewed in detail with the  patient. Please refer to After Visit Summary for other counseling recommendations.  No follow-ups on file.  No future appointments.  Venia Carbon, NP

## 2018-08-01 ENCOUNTER — Inpatient Hospital Stay (HOSPITAL_COMMUNITY)
Admission: AD | Admit: 2018-08-01 | Discharge: 2018-08-03 | DRG: 807 | Disposition: A | Discharge status: Home / Self Care | Payer: Medicaid Other | Attending: Obstetrics & Gynecology | Admitting: Obstetrics & Gynecology

## 2018-08-01 DIAGNOSIS — Z9189 Other specified personal risk factors, not elsewhere classified: Secondary | ICD-10-CM

## 2018-08-01 DIAGNOSIS — Z3A39 39 weeks gestation of pregnancy: Secondary | ICD-10-CM

## 2018-08-01 DIAGNOSIS — Z87891 Personal history of nicotine dependence: Secondary | ICD-10-CM

## 2018-08-02 ENCOUNTER — Encounter (HOSPITAL_COMMUNITY): Payer: Self-pay | Admitting: Emergency Medicine

## 2018-08-02 ENCOUNTER — Other Ambulatory Visit: Payer: Self-pay

## 2018-08-02 DIAGNOSIS — Z3A39 39 weeks gestation of pregnancy: Secondary | ICD-10-CM | POA: Diagnosis not present

## 2018-08-02 DIAGNOSIS — Z87891 Personal history of nicotine dependence: Secondary | ICD-10-CM | POA: Diagnosis not present

## 2018-08-02 DIAGNOSIS — Z3483 Encounter for supervision of other normal pregnancy, third trimester: Secondary | ICD-10-CM | POA: Diagnosis present

## 2018-08-02 LAB — TYPE AND SCREEN
ABO/RH(D): O POS
Antibody Screen: NEGATIVE

## 2018-08-02 LAB — CBC
HCT: 35.8 % — ABNORMAL LOW (ref 36.0–46.0)
Hemoglobin: 12.2 g/dL (ref 12.0–15.0)
MCH: 26.5 pg (ref 26.0–34.0)
MCHC: 34.1 g/dL (ref 30.0–36.0)
MCV: 77.8 fL — ABNORMAL LOW (ref 80.0–100.0)
Platelets: 368 10*3/uL (ref 150–400)
RBC: 4.6 MIL/uL (ref 3.87–5.11)
RDW: 16.7 % — AB (ref 11.5–15.5)
WBC: 16.1 10*3/uL — ABNORMAL HIGH (ref 4.0–10.5)
nRBC: 0 % (ref 0.0–0.2)

## 2018-08-02 LAB — RPR: RPR Ser Ql: NONREACTIVE

## 2018-08-02 LAB — ABO/RH: ABO/RH(D): O POS

## 2018-08-02 MED ORDER — LIDOCAINE HCL (PF) 1 % IJ SOLN
30.0000 mL | INTRAMUSCULAR | Status: DC | PRN
  Filled 2018-08-02: qty 30

## 2018-08-02 MED ORDER — FENTANYL CITRATE (PF) 100 MCG/2ML IJ SOLN
100.0000 ug | INTRAMUSCULAR | Status: DC | PRN
  Administered 2018-08-02: 100 ug via INTRAVENOUS
  Filled 2018-08-02: qty 2

## 2018-08-02 MED ORDER — OXYTOCIN 40 UNITS IN NORMAL SALINE INFUSION - SIMPLE MED
2.5000 [IU]/h | INTRAVENOUS | Status: DC
  Filled 2018-08-02: qty 1000

## 2018-08-02 MED ORDER — SENNOSIDES-DOCUSATE SODIUM 8.6-50 MG PO TABS
2.0000 | ORAL_TABLET | ORAL | Status: DC
  Administered 2018-08-02: 2 via ORAL
  Filled 2018-08-02 (×2): qty 2

## 2018-08-02 MED ORDER — PRENATAL MULTIVITAMIN CH
1.0000 | ORAL_TABLET | Freq: Every day | ORAL | Status: DC
  Administered 2018-08-02: 1 via ORAL
  Filled 2018-08-02: qty 1

## 2018-08-02 MED ORDER — WITCH HAZEL-GLYCERIN EX PADS: 1.0000 "application " | MEDICATED_PAD | CUTANEOUS | Status: DC | PRN

## 2018-08-02 MED ORDER — COCONUT OIL OIL
1.0000 "application " | TOPICAL_OIL | Status: DC | PRN
  Administered 2018-08-02: 1 via TOPICAL
  Filled 2018-08-02: qty 120

## 2018-08-02 MED ORDER — LACTATED RINGERS IV SOLN: 500.0000 mL | INTRAVENOUS | Status: DC | PRN

## 2018-08-02 MED ORDER — OXYTOCIN BOLUS FROM INFUSION
500.0000 mL | Freq: Once | INTRAVENOUS | Status: AC
  Administered 2018-08-02: 500 mL via INTRAVENOUS

## 2018-08-02 MED ORDER — OXYCODONE HCL 5 MG PO TABS
5.0000 mg | ORAL_TABLET | ORAL | Status: DC | PRN
  Administered 2018-08-03: 5 mg via ORAL
  Filled 2018-08-02: qty 1

## 2018-08-02 MED ORDER — SOD CITRATE-CITRIC ACID 500-334 MG/5ML PO SOLN: 30.0000 mL | ORAL | Status: DC | PRN

## 2018-08-02 MED ORDER — DIPHENHYDRAMINE HCL 25 MG PO CAPS: 25.0000 mg | ORAL_CAPSULE | Freq: Four times a day (QID) | ORAL | Status: DC | PRN

## 2018-08-02 MED ORDER — ACETAMINOPHEN 325 MG PO TABS
650.0000 mg | ORAL_TABLET | ORAL | Status: DC | PRN
  Administered 2018-08-03: 650 mg via ORAL
  Filled 2018-08-02: qty 2

## 2018-08-02 MED ORDER — ZOLPIDEM TARTRATE 5 MG PO TABS: 5.0000 mg | ORAL_TABLET | Freq: Every evening | ORAL | Status: DC | PRN

## 2018-08-02 MED ORDER — BENZOCAINE-MENTHOL 20-0.5 % EX AERO
1.0000 "application " | INHALATION_SPRAY | CUTANEOUS | Status: DC | PRN
  Administered 2018-08-02: 1 via TOPICAL
  Filled 2018-08-02: qty 56

## 2018-08-02 MED ORDER — ONDANSETRON HCL 4 MG PO TABS: 4.0000 mg | ORAL_TABLET | ORAL | Status: DC | PRN

## 2018-08-02 MED ORDER — OXYCODONE HCL 5 MG PO TABS: 10.0000 mg | ORAL_TABLET | ORAL | Status: DC | PRN

## 2018-08-02 MED ORDER — ACETAMINOPHEN 325 MG PO TABS: 650.0000 mg | ORAL_TABLET | ORAL | Status: DC | PRN

## 2018-08-02 MED ORDER — ONDANSETRON HCL 4 MG/2ML IJ SOLN: 4.0000 mg | Freq: Four times a day (QID) | INTRAMUSCULAR | Status: DC | PRN

## 2018-08-02 MED ORDER — OXYCODONE-ACETAMINOPHEN 5-325 MG PO TABS: 2.0000 | ORAL_TABLET | ORAL | Status: DC | PRN

## 2018-08-02 MED ORDER — OXYCODONE-ACETAMINOPHEN 5-325 MG PO TABS: 1.0000 | ORAL_TABLET | ORAL | Status: DC | PRN

## 2018-08-02 MED ORDER — LACTATED RINGERS IV SOLN
INTRAVENOUS | Status: DC
  Administered 2018-08-02: 02:00:00 via INTRAVENOUS

## 2018-08-02 MED ORDER — TETANUS-DIPHTH-ACELL PERTUSSIS 5-2.5-18.5 LF-MCG/0.5 IM SUSP: 0.5000 mL | Freq: Once | INTRAMUSCULAR | Status: DC

## 2018-08-02 MED ORDER — ONDANSETRON HCL 4 MG/2ML IJ SOLN: 4.0000 mg | INTRAMUSCULAR | Status: DC | PRN

## 2018-08-02 MED ORDER — DIBUCAINE 1 % RE OINT: 1.0000 "application " | TOPICAL_OINTMENT | RECTAL | Status: DC | PRN

## 2018-08-02 MED ORDER — IBUPROFEN 600 MG PO TABS
600.0000 mg | ORAL_TABLET | Freq: Four times a day (QID) | ORAL | Status: DC
  Administered 2018-08-02 – 2018-08-03 (×5): 600 mg via ORAL
  Filled 2018-08-02 (×5): qty 1

## 2018-08-02 MED ORDER — SIMETHICONE 80 MG PO CHEW: 80.0000 mg | CHEWABLE_TABLET | ORAL | Status: DC | PRN

## 2018-08-02 NOTE — H&P (Addendum)
OBSTETRIC ADMISSION HISTORY AND PHYSICAL  Katrina Foster is a 23 y.o. female G2P1001 with IUP at [redacted]w[redacted]d by LMP presenting for contractions and was admitted for early labor.    Reports fetal movement. Minimal vaginal bleeding. Patient has had contractions that started approximately at 2000 on 08/01/2018. She has had some nausea with the contractions. Denies any issues during the prenatal care. Previous child was born via SVD without complications and prenatal course was also without complications.   She received her prenatal care at [facility].  Support person in labor: FOB   Prenatal History/Complications: . 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 Medical history non-contributory     Past Surgical History: Past Surgical History:  Procedure Laterality Date  . WISDOM TOOTH EXTRACTION      Obstetrical History: OB History    Gravida  2   Para  1   Term  1   Preterm      AB      Living  1     SAB      TAB      Ectopic      Multiple      Live Births  1           Social History: Social History   Socioeconomic History  . Marital status: Single    Spouse name: Not on file  . Number of children: Not on file  . Years of education: Not on file  . Highest education level: Not on file  Occupational History  . Not on file  Social Needs  . Financial resource strain: Not on file  . Food insecurity:    Worry: Not on file    Inability: Not on file  . Transportation needs:    Medical: Not on file    Non-medical: Not on file  Tobacco Use  . Smoking status: Former Games developer  . Smokeless tobacco: Never Used  Substance and Sexual Activity  . Alcohol use: Not Currently  . Drug use: Never  . Sexual activity: Yes  Lifestyle  . Physical activity:    Days per week: Not on file    Minutes per session: Not on file  . Stress: Not on file  Relationships  . Social connections:    Talks on phone: Not on file     Gets together: Not on file    Attends religious service: Not on file    Active member of club or organization: Not on file    Attends meetings of clubs or organizations: Not on file    Relationship status: Not on file  Other Topics Concern  . Not on file  Social History Narrative  . Not on file    Family History: History reviewed. No pertinent family history.  Allergies: No Known Allergies  Medications Prior to Admission  Medication Sig Dispense Refill Last Dose  . acetaminophen (TYLENOL) 500 MG tablet Take 2 tablets (1,000 mg total) by mouth every 6 (six) hours as needed for fever or headache. 30 tablet 0 Taking  . Elastic Bandages & Supports (COMFORT FIT MATERNITY SUPP LG) MISC 1 application by Does not apply route daily. 1 each 0 Taking  . Prenat-FeFum-FePo-FA-Omega 3 (CONCEPT DHA) 53.5-38-1 MG CAPS Take 1 tablet by mouth daily. 30 capsule 12 Taking  . ranitidine (ZANTAC) 150 MG tablet Take 1 tablet (150 mg total) by mouth at bedtime. 60 tablet 1 Taking     Review of  Systems  All systems reviewed and negative except as stated in HPI  Blood pressure 137/80, pulse 85, temperature 98.2 F (36.8 C), temperature source Oral, resp. rate 17, height 5\' 1"  (1.549 m), weight 85.7 kg, last menstrual period 10/30/2017, SpO2 100 %. General appearance: alert and cooperative Lungs: no respiratory distress Heart: regular rate  Abdomen: soft, non-tender; gravid  Extremities: Homans sign is negative, no sign of DVT Presentation: cephalic Fetal monitoring: Category 2: FHR 130's, moderate variability, occasional variable decelerations but with appropriate 15 x 15 accelerations Uterine activity: regular contractions Dilation: 4 Effacement (%): 90 Station: 0 Exam by:: Everlene Farrier, RN  Prenatal labs: ABO, Rh: O/RH(D) POSITIVE/-- (06/28 0904) Antibody: NO ANTIBODIES DETECTED (06/28 0904) Rubella: 2.17 (06/28 0904) RPR: NON-REACTIVE (11/01 1053)  HBsAg: NON-REACTIVE (06/28 0904)  HIV:  NON-REACTIVE (11/01 1053)  GBS:   negative  NIPS: negative  AFP: negative   Prenatal Transfer Tool  Maternal Diabetes: No Genetic Screening: Normal Maternal Ultrasounds/Referrals: Abnormal:  Findings:   Absent nasal bone Fetal Ultrasounds or other Referrals:  None Maternal Substance Abuse:  No Significant Maternal Medications:  None Significant Maternal Lab Results: None  No results found for this or any previous visit (from the past 24 hour(s)).  Patient Active Problem List   Diagnosis Date Noted  . Normal labor 08/02/2018  . Choroid plexus cyst of fetus affecting care of mother, antepartum 05/14/2018  . Poor weight gain of pregnancy 03/19/2018  . Nausea and vomiting of pregnancy, antepartum 01/22/2018  . Supervision of other normal pregnancy, antepartum 01/08/2018    Assessment/Plan:  Katrina Foster is a 23 y.o. female G2P1001 with IUP at [redacted]w[redacted]d by LMP presenting for contractions and was admitted for early labor.    Labor: Early -- pain control: no epidural or IV pain meds   Fetal Wellbeing:  Cephalic by [place].  -- GBS (neg) -- continuous fetal monitoring - see above    Postpartum Planning -- breast/nexplanon  Rollene Rotunda, DO PGYI Family Medicine   OB FELLOW HISTORY AND PHYSICAL ATTESTATION  I have seen and examined this patient; I agree with above documentation in the resident's note.   Marcy Siren, D.O. OB Fellow  08/02/2018, 8:37 PM

## 2018-08-02 NOTE — Progress Notes (Signed)
OB/GYN Faculty Practice: Labor Progress Note  Subjective: Miriya Ruggiero is a 23 y.o. female G2P1001 with IUP at [redacted]w[redacted]d by LMP presenting for contractions and was admitted for early labor.    Objective: BP 137/80 (BP Location: Left Arm)   Pulse 85   Temp 97.6 F (36.4 C) (Oral)   Resp 20   Ht 5\' 1"  (1.549 m)   Wt 85.7 kg   LMP 10/30/2017   SpO2 100%   BMI 35.71 kg/m  Gen: alert, cooperative Dilation: 8 Effacement (%): 90 Cervical Position: Anterior Station: -1 Presentation: Vertex Exam by:: Suezanne Jacquet, RN  Assessment and Plan: Zurri Obryan is a 23 y.o. female G2P1001 with IUP at [redacted]w[redacted]d by LMP presenting for contractions and was admitted for early labor.    #active labor -progressed quickly from 4 to 8 cm. Still with bulging bag. AROM performed by Dr. Earlene Plater with clear fluid.   Labor: Active  -- pain control: none  Fetal Well-Being:  Cephalic by [place].  -- Category 2, 130's moderate variability, occasional variable decel's, appropriate accel's -- GBS neg   Rollene Rotunda, DO PGYI Family Medicine 2:59 AM

## 2018-08-02 NOTE — Lactation Note (Signed)
This note was copied from a baby's chart. Lactation Consultation Note  Patient Name: Boy Skylene Ottmar DEYCX'K Date: 08/02/2018 Reason for consult: Initial assessment;Term Newborn is 43 hours old.  Mom chooses to both breast and formula feed.  Last baby did not latch.  She is pleased that infant is latching easily to breast.  Formula given x 1.  Instructed to put baby to breast with any feeding cue. Encouraged to call for assist prn.  Breastfeeding consultation services and support information given and reviewed.  Maternal Data Has patient been taught Hand Expression?: Yes Does the patient have breastfeeding experience prior to this delivery?: Yes  Feeding Feeding Type: Formula Nipple Type: Slow - flow  LATCH Score                   Interventions    Lactation Tools Discussed/Used     Consult Status Consult Status: Follow-up Date: 08/03/18 Follow-up type: In-patient    Huston Foley 08/02/2018, 12:04 PM

## 2018-08-02 NOTE — MAU Note (Signed)
Pt presents to mau with ctx that started at 2000 she rates a 7/10. + FM Denies LOF  Pt reports some VB when she wipes.

## 2018-08-03 MED ORDER — IBUPROFEN 600 MG PO TABS: 600.0000 mg | ORAL_TABLET | Freq: Four times a day (QID) | ORAL | 0 refills | Status: AC

## 2018-08-03 NOTE — Lactation Note (Signed)
This note was copied from a baby's chart. Lactation Consultation Note  Patient Name: Katrina Foster OILNZ'V Date: 08/03/2018 Reason for consult: Follow-up assessment Baby is 38 hours old.  Mom states baby is latching well most of the time.  She chooses to supplement with formula.  Instructed to feed with cues and call for assist prn.  Maternal Data    Feeding Feeding Type: Bottle Fed - Formula Nipple Type: Slow - flow  LATCH Score                   Interventions    Lactation Tools Discussed/Used     Consult Status Consult Status: Follow-up Date: 08/04/18 Follow-up type: In-patient    Huston Foley 08/03/2018, 10:43 AM

## 2018-08-03 NOTE — Discharge Summary (Signed)
Postpartum Discharge Summary     Patient Name: Katrina Foster DOB: 1995/09/20 MRN: [id number]  Date of admission: 08/01/2018 Delivering Provider: Arvilla MarketWALLACE, CATHERINE LAUREN   Date of discharge: 08/03/2018  Admitting diagnosis: 39.3WKS CTX Intrauterine pregnancy: 6574w3d     Secondary diagnosis:  Active Problems:   Normal labor  Additional problems: None     Discharge diagnosis: Term Pregnancy Delivered                                                                                                Post partum procedures:none  Augmentation: AROM  Complications: None  Hospital course:  Onset of Labor With Vaginal Delivery     23 y.o. yo [id number] at 2474w3d was admitted in Active Labor on 08/01/2018. Patient had an uncomplicated labor course as follows:  Membrane Rupture Time/Date: 2:58 AM ,08/02/2018   Intrapartum Procedures: Episiotomy: None [1]                                         Lacerations:  None [1]  Patient had a delivery of a Viable infant. 08/02/2018  Information for the patient's newborn:  Biagio BorgLuna-Torres, Boy Favour [[id number]][[id number]]  Delivery Method: Vaginal, Spontaneous(Filed from Delivery Summary)    Pateint had an uncomplicated postpartum course.  She is ambulating, tolerating a regular diet, passing flatus, and urinating well. Patient is discharged home in stable condition on 08/03/18.   Magnesium Sulfate recieved: No BMZ received: No  Physical exam  Vitals:   08/02/18 1415 08/02/18 1813 08/02/18 2157 08/03/18 0612  BP: 109/68 119/73 113/65 95/61  Pulse: 78 84 94 75  Resp: 20 19 17 18   Temp: 98.8 F (37.1 C) 98.6 F (37 C) 99 F (37.2 C) 98.6 F (37 C)  TempSrc: Oral Oral Oral Oral  SpO2:      Weight:      Height:       General: alert, cooperative and no distress Lochia: appropriate Uterine Fundus: firm Incision: N/A DVT Evaluation: No evidence of DVT seen on physical exam. Labs: Lab Results  Component Value Date   WBC 16.1 (H) 08/02/2018   HGB 12.2 08/02/2018   HCT 35.8 (L) 08/02/2018   MCV 77.8 (L) 08/02/2018   PLT 368 08/02/2018   No flowsheet data found.  Discharge instruction: per After Visit Summary and "Baby and Me Booklet".  After visit meds:  Allergies as of 08/03/2018   No Known Allergies     Medication List    TAKE these medications   COMFORT FIT MATERNITY SUPP LG Misc 1 application by Does not apply route daily.   ibuprofen 600 MG tablet Commonly known as:  ADVIL,MOTRIN Take 1 tablet (600 mg total) by mouth every 6 (six) hours.       Diet: routine diet  Activity: Advance as tolerated. Pelvic rest for 6 weeks.   Outpatient follow up:4 weeks Follow up Appt:No future appointments.  Newborn Data: Live born female  Birth Weight: 6 lb 5.2 oz (2869 g) APGAR: 8, 9  Newborn Delivery   Birth date/time:  08/02/2018 03:17:00 Delivery type:  Vaginal, Spontaneous     Baby Feeding: Breast Disposition:home with mother   08/03/2018 Gwenevere Abbot, MD

## 2018-08-04 ENCOUNTER — Encounter: Payer: Medicaid Other | Admitting: Obstetrics and Gynecology

## 2018-08-31 ENCOUNTER — Ambulatory Visit: Payer: Medicaid Other | Admitting: Student

## 2018-09-02 ENCOUNTER — Ambulatory Visit: Payer: Medicaid Other | Admitting: Advanced Practice Midwife

## 2018-09-22 ENCOUNTER — Encounter: Payer: Self-pay | Admitting: Obstetrics and Gynecology

## 2018-09-22 ENCOUNTER — Ambulatory Visit (INDEPENDENT_AMBULATORY_CARE_PROVIDER_SITE_OTHER): Payer: Medicaid Other | Admitting: Obstetrics and Gynecology

## 2018-09-22 ENCOUNTER — Other Ambulatory Visit: Payer: Self-pay

## 2018-09-22 VITALS — BP 115/66 | HR 77 | Ht 64.0 in | Wt 173.0 lb

## 2018-09-22 DIAGNOSIS — Z30017 Encounter for initial prescription of implantable subdermal contraceptive: Secondary | ICD-10-CM

## 2018-09-22 DIAGNOSIS — Z3202 Encounter for pregnancy test, result negative: Secondary | ICD-10-CM

## 2018-09-22 DIAGNOSIS — Z01812 Encounter for preprocedural laboratory examination: Secondary | ICD-10-CM

## 2018-09-22 DIAGNOSIS — Z1389 Encounter for screening for other disorder: Secondary | ICD-10-CM | POA: Diagnosis not present

## 2018-09-22 LAB — POCT URINE PREGNANCY: Preg Test, Ur: NEGATIVE

## 2018-09-22 MED ORDER — ETONOGESTREL 68 MG ~~LOC~~ IMPL
68.0000 mg | DRUG_IMPLANT | Freq: Once | SUBCUTANEOUS | Status: AC
  Administered 2018-09-22: 68 mg via SUBCUTANEOUS

## 2018-09-22 NOTE — Progress Notes (Signed)
Post Partum Exam  Katrina Foster is a 23 y.o. G27P2002 female who presents for a postpartum visit. She is 8 weeks postpartum following a spontaneous vaginal delivery. I have fully reviewed the prenatal and intrapartum course. The delivery was at 39  gestational weeks 4 days.  Anesthesia: none. Postpartum course has been unremarkable. Baby's course has been unremarkable. Baby is feeding by Enfamil NeuroPro. Bleeding no bleeding. Bowel function is normal. Bladder function is normal. Patient is sexually active. Contraception method is Nexplanon. Postpartum depression screening:neg  The following portions of the patient's history were reviewed and updated as appropriate: allergies, current medications, past family history, past medical history, past social history, past surgical history and problem list. Last pap smear done 2019 and was Normal  Review of Systems Pertinent items are noted in HPI.    Objective:  Blood pressure 115/66, pulse 77, height 5\' 4"  (1.626 m), weight 173 lb (78.5 kg), unknown if currently breastfeeding.  General:  alert  Lungs: clear to auscultation bilaterally  Heart:  regular rate and rhythm, S1, S2 normal, no murmur, click, rub or gallop  Abdomen: soft, non-tender; bowel sounds normal; no masses,  no organomegaly        Assessment:   Normal postpartum exam. Pap smear not done at today's visit.   Plan:   1. Contraception: Nexplanon 2. Doing well  3. Follow up as needed.         GYNECOLOGY OFFICE PROCEDURE NOTE  Katrina Foster is a 23 y.o. P7H4327 here for Nexplanon insertion. No other gynecologic concerns.  Nexplanon Insertion Procedure Patient identified, informed consent performed, consent signed.   Patient does understand that irregular bleeding is a very common side effect of this medication. She was advised to have backup contraception for one week after placement. Pregnancy test in clinic today was negative.  Appropriate time out taken.   Patient's left arm was prepped and draped in the usual sterile fashion. The ruler used to measure and mark insertion area.  Patient was prepped with alcohol swab and then injected with 3 ml of 1% lidocaine.  She was prepped with betadine, Nexplanon removed from packaging,  Device confirmed in needle, then inserted full length of needle and withdrawn per handbook instructions. Nexplanon was able to palpated in the patient's arm; patient palpated the insert herself. There was minimal blood loss.  Patient insertion site covered with guaze and a pressure bandage to reduce any bruising.  The patient tolerated the procedure well and was given post procedure instructions.     , Katrina Rutherford, NP [facility] for [employer], [facility]

## 2018-10-13 IMAGING — US US MFM OB DETAIL+14 WK
1 series · 14 of 28 positions shown · non-contrast
Comparison: none

[Series 1: us mfm ob detail+14 wk · 124 acquisitions, 14 frames shown]
[im 5/124]
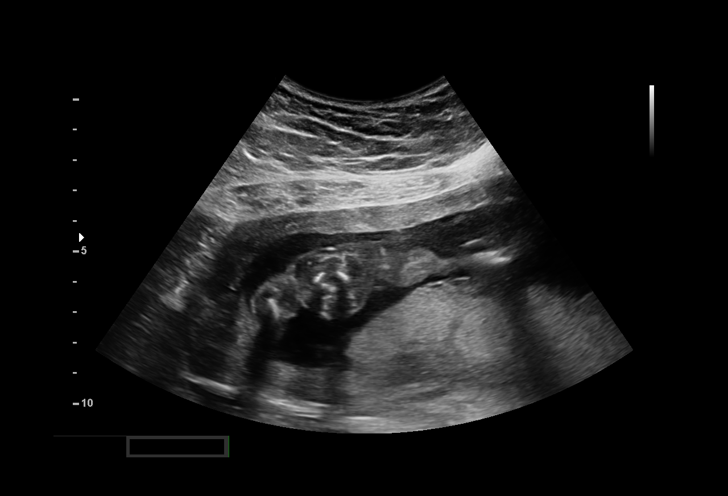
[im 14/124]
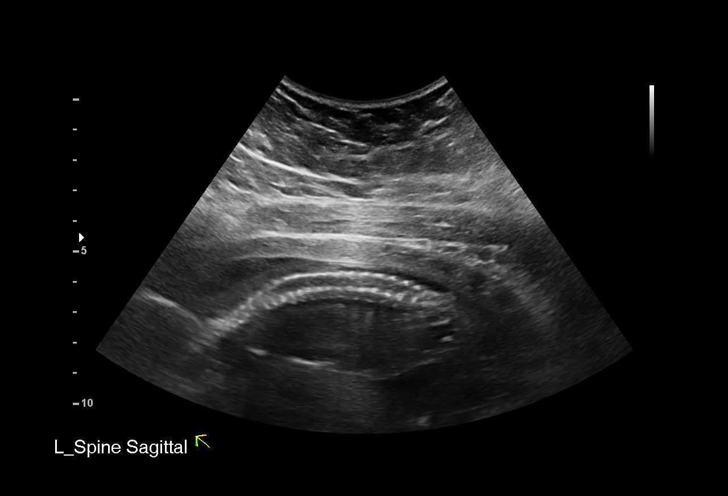
[im 23/124]
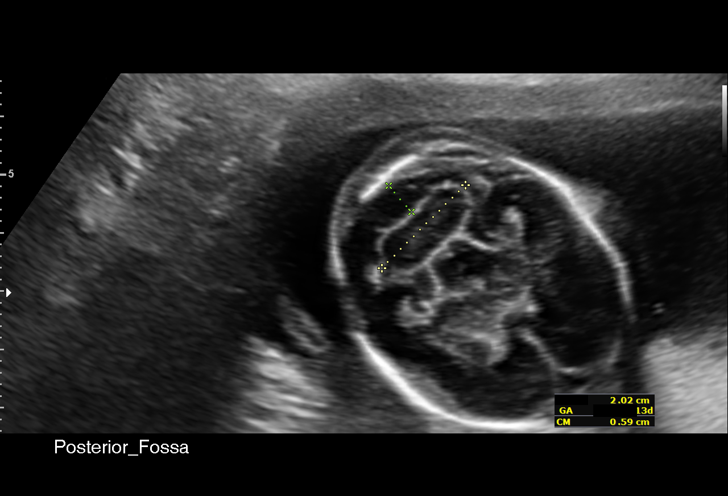
[im 32/124]
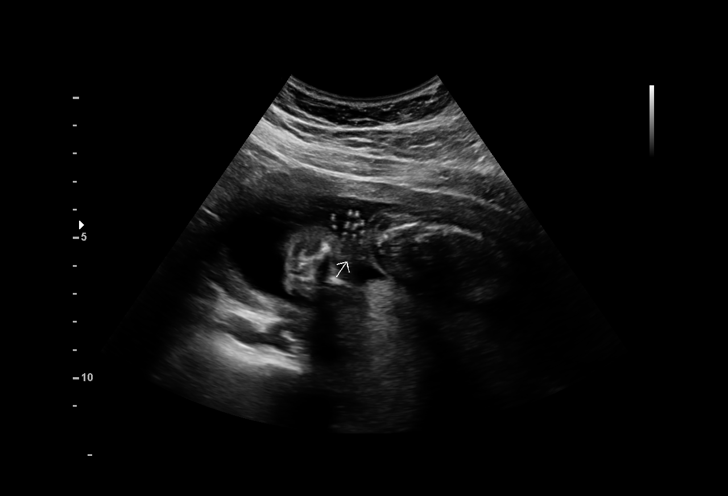
[im 42/124]
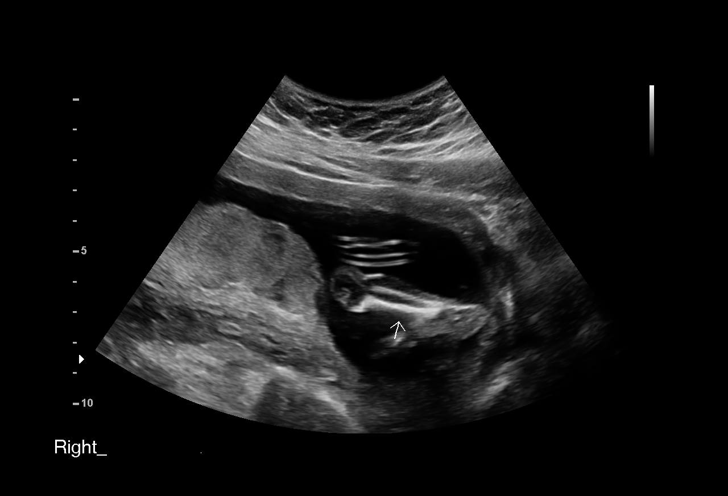
[im 51/124]
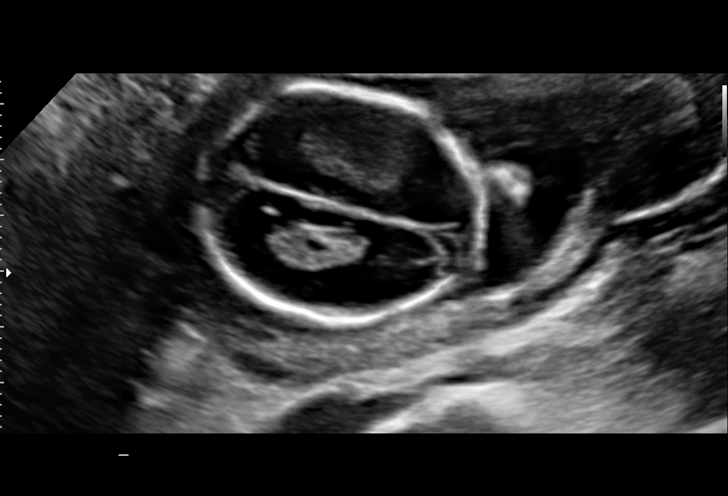
[im 60/124]
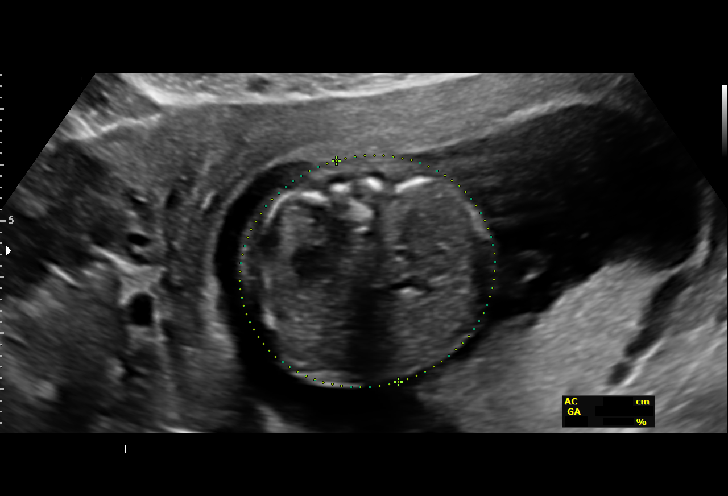
[im 69/124]
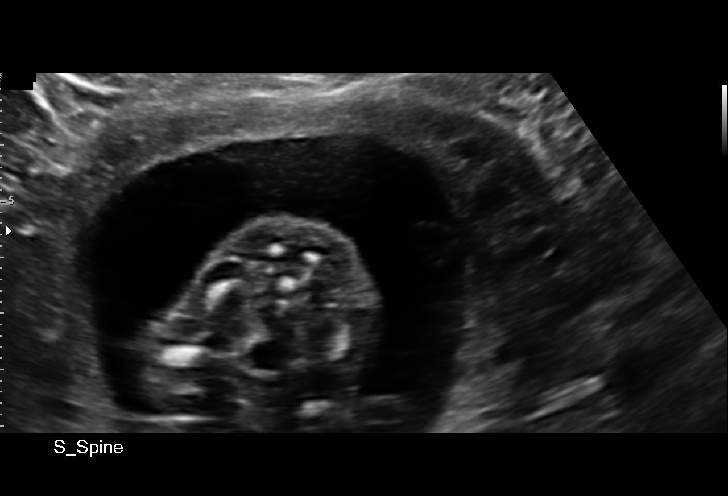
[im 78/124]
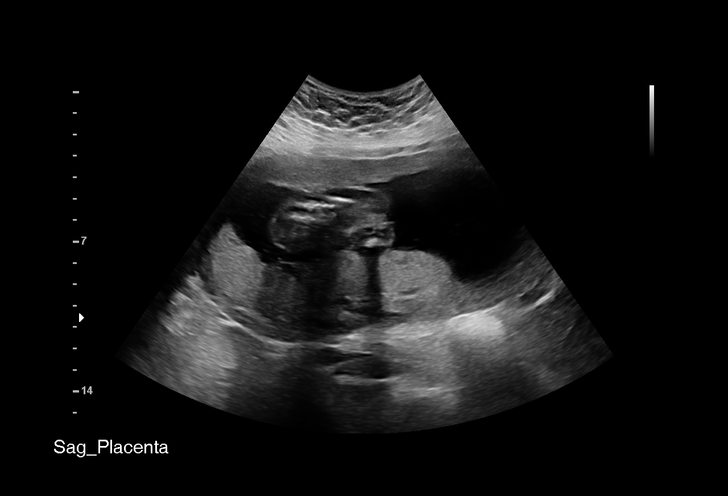
[im 87/124]
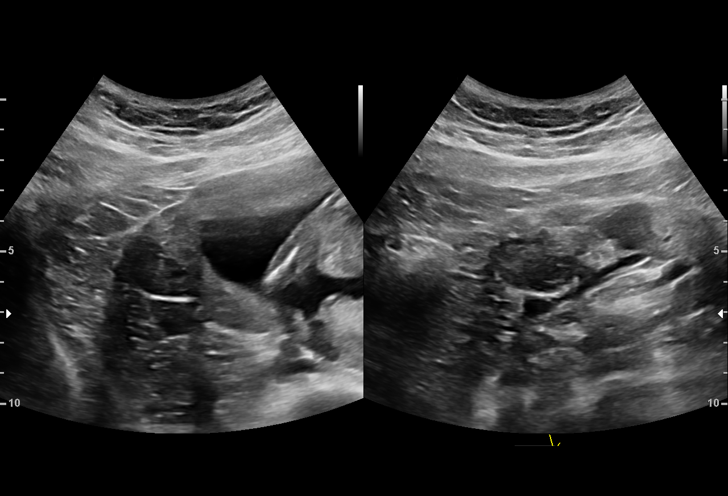
[im 96/124]
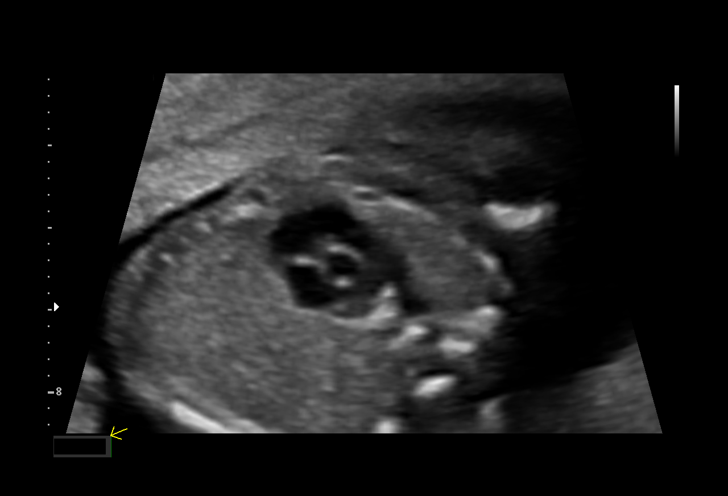
[im 105/124]
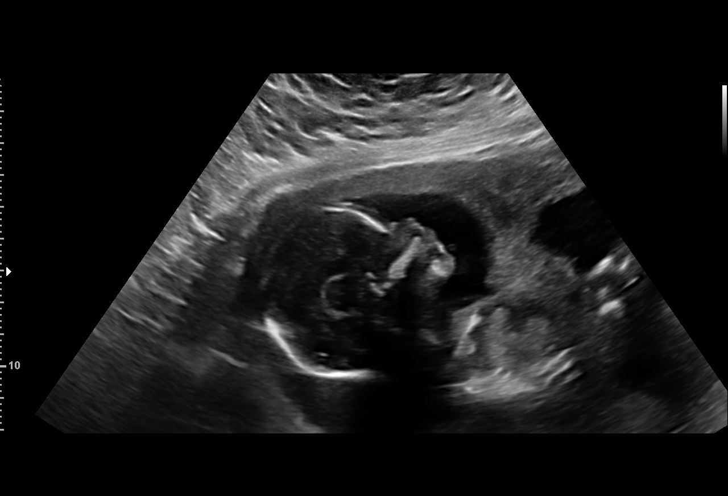
[im 114/124]
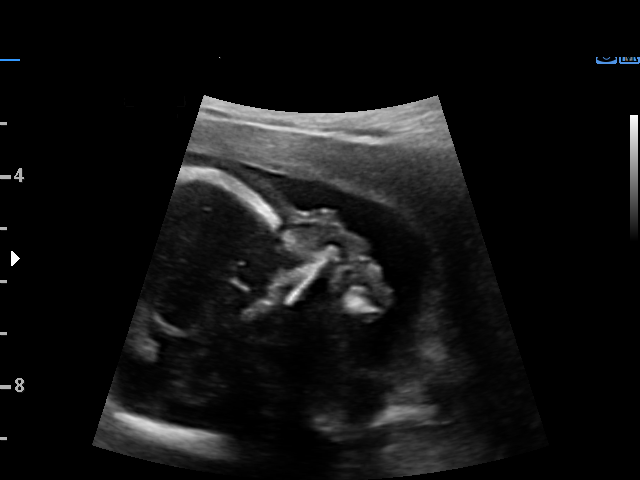
[im 124/124]
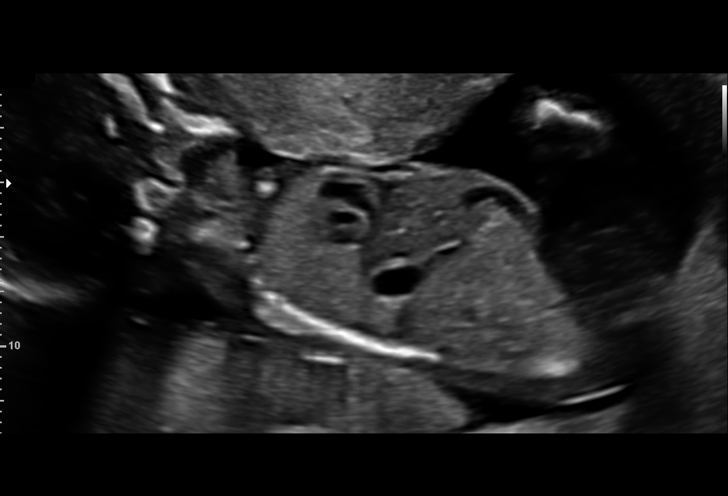

[14 of 28 positions shown; findings below may reference images not displayed]

[REDACTED]
JASE CNM

Indications

18 weeks gestation of pregnancy
Encounter for antenatal screening for
malformations
Obesity complicating pregnancy, second
trimester (Pre pregnancy BMI 35)
Vital Signs

BMI:
Fetal Evaluation

Num Of Fetuses:         1
Fetal Heart Rate(bpm):  141
Cardiac Activity:       Observed
Presentation:           Breech
Placenta:               Posterior
P. Cord Insertion:      Visualized

Amniotic Fluid
AFI FV:      Within normal limits

Largest Pocket(cm)
6.29
Biometry

BPD:      41.6  mm     G. Age:  18w 4d         46  %    CI:        73.39   %    70 - 86
FL/HC:      20.1   %    16.1 -
HC:      154.3  mm     G. Age:  18w 3d         27  %    HC/AC:      1.16        1.09 -
AC:      132.8  mm     G. Age:  18w 5d         48  %    FL/BPD:     74.5   %
FL:         31  mm     G. Age:  19w 4d         75  %    FL/AC:      23.3   %    20 - 24
HUM:        30  mm     G. Age:  19w 6d         82  %
CER:      19.8  mm     G. Age:  18w 6d         57  %
NFT:       3.5  mm
LV:          6  mm
CM:        5.7  mm

Est. FW:     273  gm    0 lb 10 oz      52  %
OB History

Gravidity:    2         Term:   1
Living:       1
Gestational Age

LMP:           18w 5d        Date:  10/30/17                 EDD:   08/06/18
U/S Today:     18w 6d                                        EDD:   08/05/18
Best:          18w 5d     Det. By:  LMP  (10/30/17)          EDD:   08/06/18
Anatomy

Cranium:               Appears normal         Aortic Arch:            Appears normal
Cavum:                 Appears normal         Ductal Arch:            Appears normal
Ventricles:            Appears normal         Diaphragm:              Appears normal
Choroid Plexus:        Left choroid           Stomach:                Appears normal, left
plexus cyst,  4 mm
sided
Cerebellum:            Appears normal         Abdomen:                Appears normal
Posterior Fossa:       Appears normal         Abdominal Wall:         Appears nml (cord
insert, abd wall)
Nuchal Fold:           Appears normal         Cord Vessels:           Appears normal (3
vessel cord)
Face:                  Appears normal         Kidneys:                Appear normal
(orbits and DOBGEY
Lips:                  Appears normal         Bladder:                Appears normal
Thoracic:              Appears normal         Spine:                  Appears normal
Heart:                 Appears normal         Upper Extremities:      Appears normal
(4CH, axis, and situs
RVOT:                  Appears normal         Lower Extremities:      Appears normal; ?
Rt Yohannes
LVOT:                  Appears normal

Other:  Fetus appears to be a male. Heels and LT 5th digit visualized.Open
hands visualized. Technically difficult due to maternal habitus and
fetal position; nasal bone appears poorly calcified
Cervix Uterus Adnexa

Cervix
Length:           5.28  cm.
Normal appearance by transabdominal scan.

Uterus
No abnormality visualized.

Left Ovary
Size(cm)        4   x   2.8    x  2.1       Vol(ml):
Within normal limits.
Right Ovary
Size(cm)       3.2  x   1.9    x  2.3       Vol(ml):
Within normal limits.
Comments

U/S images reviewed. Findings reviewed with patient.
Appropriate fetal growth is noted.  Fetal nasal bone is poorly
calcified.  Small unilateral choroid plexus cyst measuring 4
mm is identified.  NIPT testing is low risk.  No other fetal
abnormalities are seen.
Questions answered.
10 minutes spent face to face.
Recommendations: 1) Follow-up at 28 weeks for CPC
Recommendations

1) Follow-up as clinically indicated

## 2019-02-07 IMAGING — US US MFM OB FOLLOW-UP
1 series · 14 of 28 positions shown · non-contrast
Comparison: none

[Series 1: us mfm ob follow-up · 28 acquisitions, 14 frames shown]
[im 2/28]
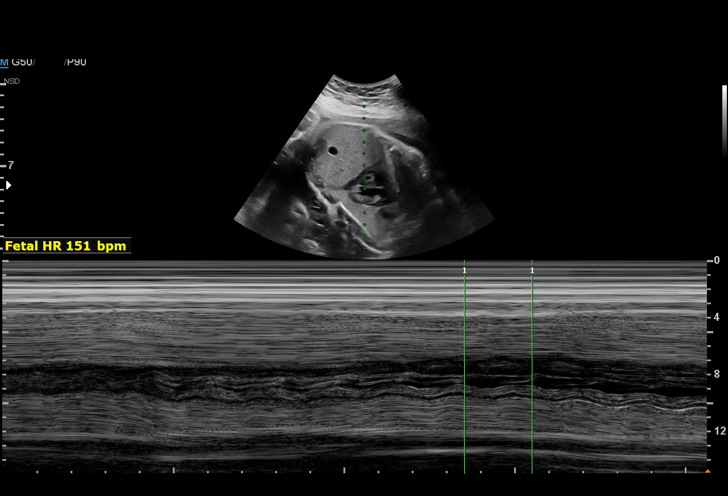
[im 4/28]
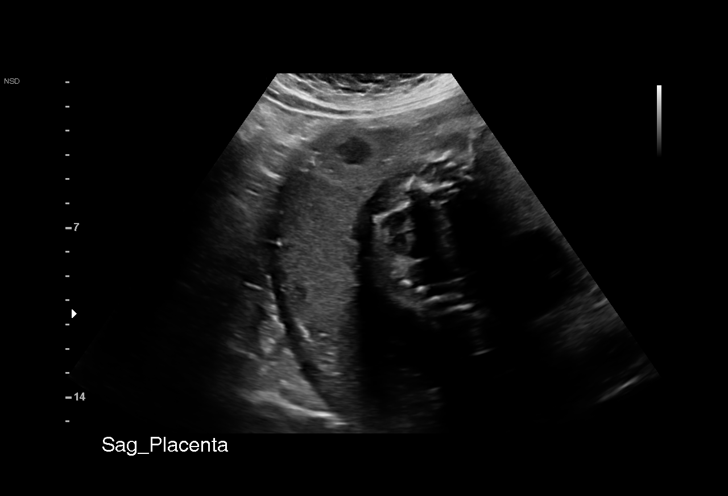
[im 6/28]
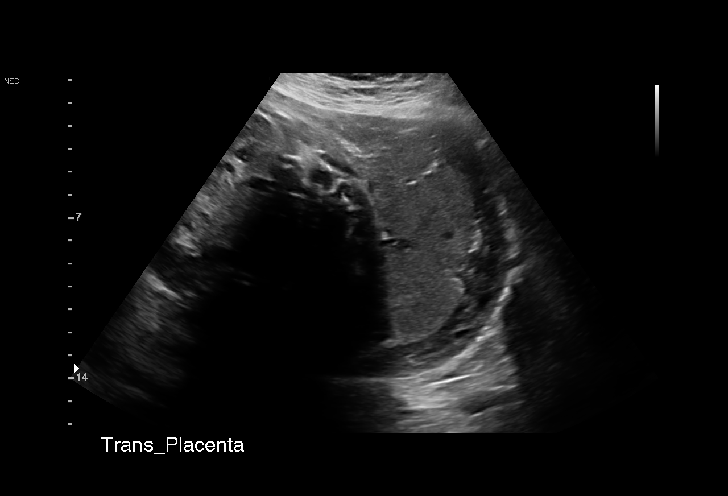
[im 8/28]
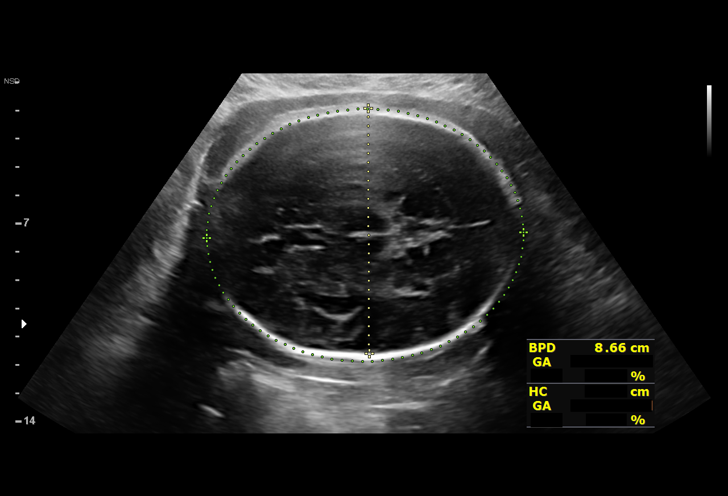
[im 10/28]
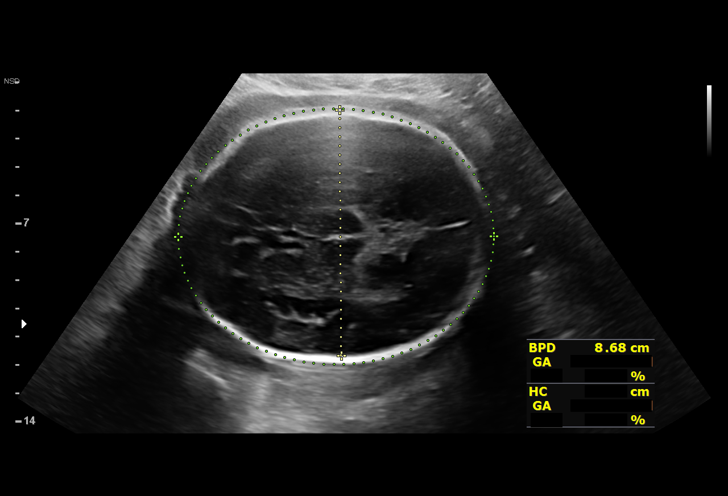
[im 12/28]
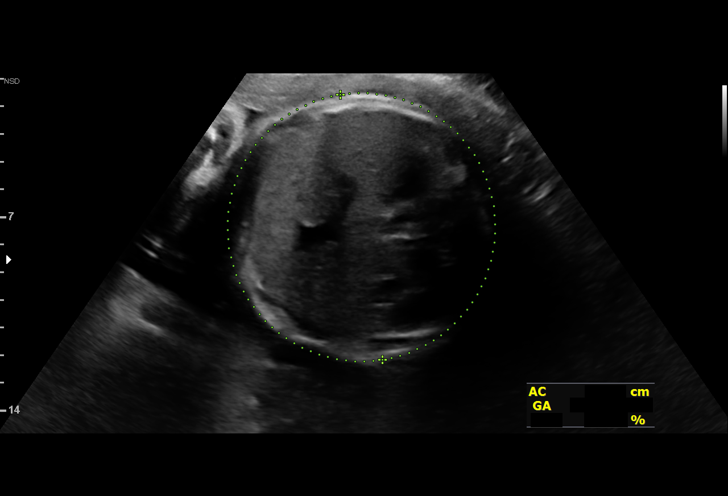
[im 14/28]
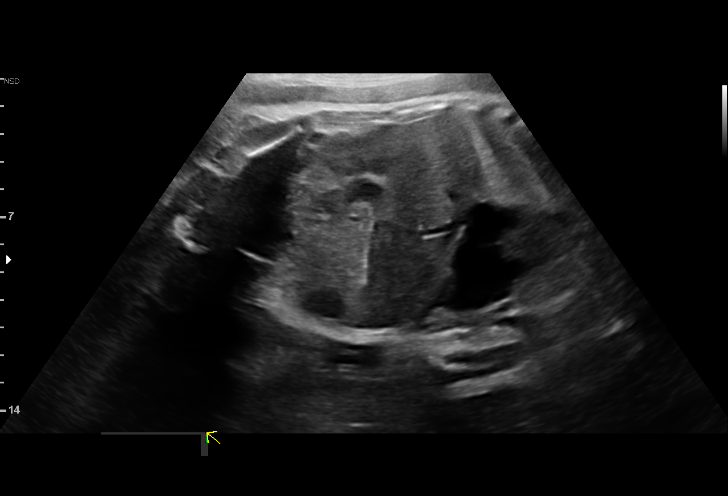
[im 16/28]
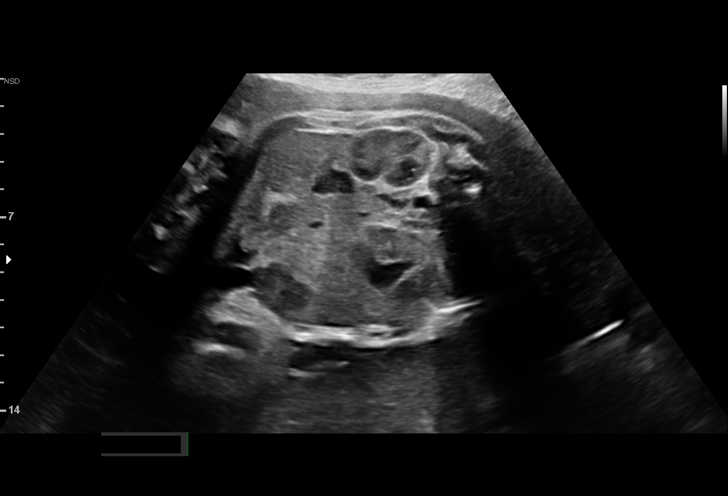
[im 18/28]
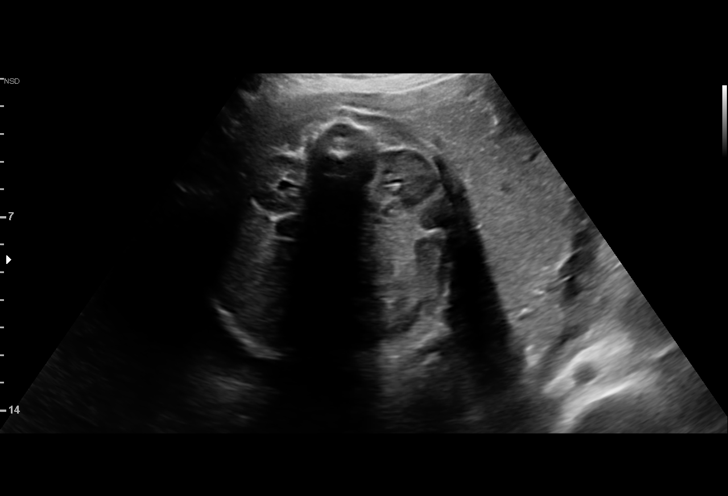
[im 20/28]
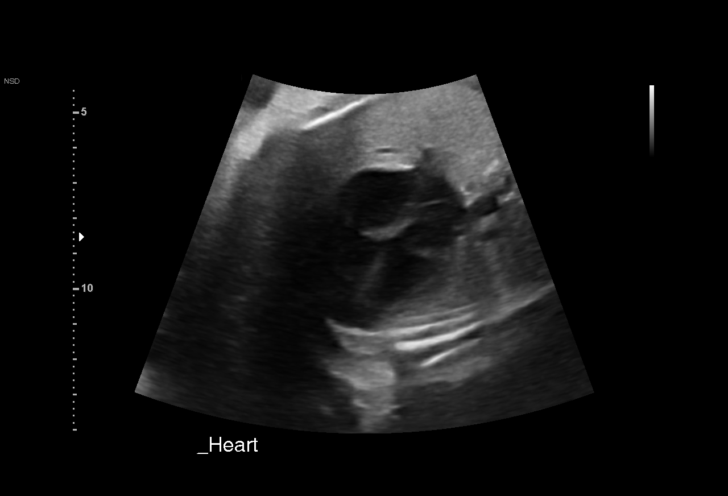
[im 22/28]
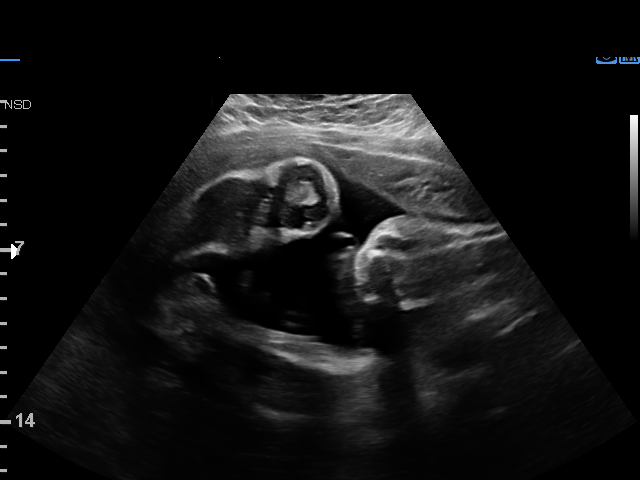
[im 24/28]
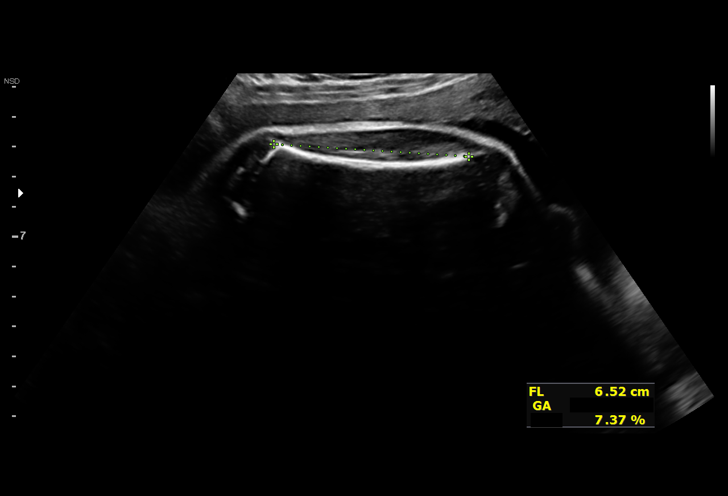
[im 26/28]
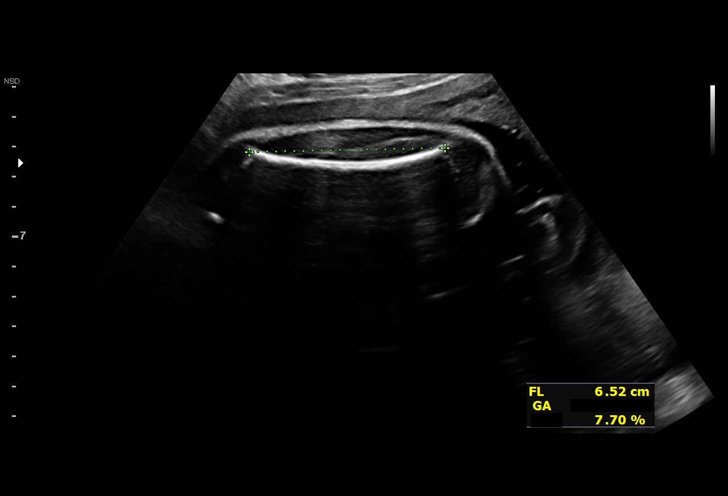
[im 28/28]
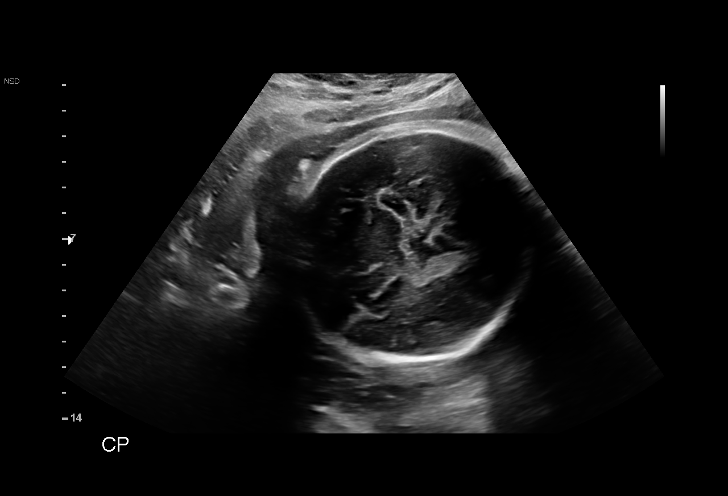

[14 of 28 positions shown; findings below may reference images not displayed]

[REDACTED]
                   ARA CNM

 ----------------------------------------------------------------------

 ----------------------------------------------------------------------
Indications

  Encounter for other antenatal screening
  follow-up (LOW risk NIPS)
  35 weeks gestation of pregnancy
  Obesity complicating pregnancy, second
  trimester (Pre pregnancy BMI 35)
 ----------------------------------------------------------------------
Vital Signs

                                                Height:        5'1"
Fetal Evaluation

 Num Of Fetuses:          1
 Fetal Heart Rate(bpm):   151
 Cardiac Activity:        Observed
 Presentation:            Cephalic
 Placenta:                Posterior
 P. Cord Insertion:       Previously Visualized

 Amniotic Fluid
 AFI FV:      Within normal limits

 AFI Sum(cm)     %Tile       Largest Pocket(cm)
 10.83           27

 RUQ(cm)       RLQ(cm)       LUQ(cm)        LLQ(cm)

Biometry
 BPD:      86.8  mm     G. Age:  35w 0d         43  %    CI:        74.66   %    70 - 86
                                                         FL/HC:       20.5  %    20.1 -
 HC:      318.8  mm     G. Age:  35w 6d         29  %    HC/AC:       1.04       0.93 -
 AC:      307.8  mm     G. Age:  34w 5d         38  %    FL/BPD:      75.1  %    71 - 87
 FL:       65.2  mm     G. Age:  33w 4d          8  %    FL/AC:       21.2  %    20 - 24
 LV:        3.8  mm

 Est. FW:    5314   gm     5 lb 7 oz     44  %
OB History

 Gravidity:    2         Term:   1
 Living:       1
Gestational Age

 LMP:           35w 3d        Date:  10/30/17                 EDD:   08/06/18
 U/S Today:     34w 6d                                        EDD:   08/10/18
 Best:          35w 3d     Det. By:  LMP  (10/30/17)          EDD:   08/06/18
Anatomy

 Cranium:               Appears normal         Aortic Arch:            Previously seen
 Cavum:                 Previously seen        Ductal Arch:            Previously seen
 Ventricles:            Appears normal         Diaphragm:              Appears normal
 Choroid Plexus:        Resolved LT CPC        Stomach:                Appears normal, left
                                                                       sided
 Cerebellum:            Previously seen        Abdomen:                Previously seen
 Posterior Fossa:       Previously seen        Abdominal Wall:         Previously seen
 Nuchal Fold:           Previously seen        Cord Vessels:           Previously seen
 Face:                  Absent nasal           Kidneys:                Appear normal
                        bone; orb prev vis
 Lips:                  Previously seen        Bladder:                Appears normal
 Thoracic:              Appears normal         Spine:                  Previously seen
 Heart:                 Appears normal         Upper Extremities:      Previously seen
                        (4CH, axis, and situs
 RVOT:                  Previously seen        Lower Extremities:      Previously seen
 LVOT:                  Previously seen

 Other:  Male gender previously seen. Heels and LT 5th digit prev visualized.
         Open hands prev visualized.
Cervix Uterus Adnexa

 Cervix
 Not visualized (advanced GA >03wks)
Impression

 Normal interval growth.
Recommendations

 Follow up as clinically indicated.
